# Patient Record
Sex: Male | Born: 1955 | Race: White | Hispanic: No | Smoking: Current every day smoker
Health system: Southern US, Community
[De-identification: ages and names within clinical notes are randomized; demographics above are authoritative.]

## PROBLEM LIST (undated history)

## (undated) DIAGNOSIS — R7302 Impaired glucose tolerance (oral): Secondary | ICD-10-CM

## (undated) DIAGNOSIS — E0590 Thyrotoxicosis, unspecified without thyrotoxic crisis or storm: Secondary | ICD-10-CM

## (undated) DIAGNOSIS — E785 Hyperlipidemia, unspecified: Secondary | ICD-10-CM

## (undated) DIAGNOSIS — Z86010 Personal history of colonic polyps: Secondary | ICD-10-CM

## (undated) DIAGNOSIS — H409 Unspecified glaucoma: Secondary | ICD-10-CM

## (undated) DIAGNOSIS — K649 Unspecified hemorrhoids: Secondary | ICD-10-CM

## (undated) DIAGNOSIS — K5790 Diverticulosis of intestine, part unspecified, without perforation or abscess without bleeding: Secondary | ICD-10-CM

## (undated) DIAGNOSIS — Z8719 Personal history of other diseases of the digestive system: Secondary | ICD-10-CM

## (undated) DIAGNOSIS — N529 Male erectile dysfunction, unspecified: Secondary | ICD-10-CM

## (undated) DIAGNOSIS — F419 Anxiety disorder, unspecified: Secondary | ICD-10-CM

## (undated) DIAGNOSIS — N400 Enlarged prostate without lower urinary tract symptoms: Secondary | ICD-10-CM

## (undated) DIAGNOSIS — I10 Essential (primary) hypertension: Secondary | ICD-10-CM

## (undated) HISTORY — PX: OTHER SURGICAL HISTORY: SHX169

## (undated) HISTORY — PX: TONSILLECTOMY: SUR1361

## (undated) HISTORY — PX: ESOPHAGOGASTRODUODENOSCOPY: SHX1529

## (undated) HISTORY — PX: HEMORRHOID SURGERY: SHX153

---

## 2006-09-14 ENCOUNTER — Emergency Department: Payer: Self-pay | Admitting: Emergency Medicine

## 2007-01-21 ENCOUNTER — Ambulatory Visit: Payer: Self-pay | Admitting: Family Medicine

## 2007-01-28 ENCOUNTER — Ambulatory Visit: Payer: Self-pay | Admitting: Family Medicine

## 2007-03-06 ENCOUNTER — Ambulatory Visit: Payer: Self-pay | Admitting: Gastroenterology

## 2007-03-18 ENCOUNTER — Ambulatory Visit: Payer: Self-pay | Admitting: Family Medicine

## 2007-06-10 ENCOUNTER — Ambulatory Visit: Payer: Self-pay | Admitting: Family Medicine

## 2010-07-26 ENCOUNTER — Emergency Department: Payer: Self-pay

## 2011-02-01 ENCOUNTER — Emergency Department: Payer: Self-pay | Admitting: Emergency Medicine

## 2011-10-25 HISTORY — PX: COLONOSCOPY: SHX174

## 2018-05-02 ENCOUNTER — Emergency Department: Payer: BLUE CROSS/BLUE SHIELD

## 2018-05-02 ENCOUNTER — Encounter: Payer: Self-pay | Admitting: Emergency Medicine

## 2018-05-02 ENCOUNTER — Other Ambulatory Visit: Payer: Self-pay

## 2018-05-02 ENCOUNTER — Emergency Department
Admission: EM | Admit: 2018-05-02 | Discharge: 2018-05-02 | Disposition: A | Discharge status: Home / Self Care | Payer: BLUE CROSS/BLUE SHIELD | Attending: Emergency Medicine | Admitting: Emergency Medicine

## 2018-05-02 DIAGNOSIS — S81812A Laceration without foreign body, left lower leg, initial encounter: Secondary | ICD-10-CM | POA: Diagnosis present

## 2018-05-02 DIAGNOSIS — Y999 Unspecified external cause status: Secondary | ICD-10-CM | POA: Diagnosis not present

## 2018-05-02 DIAGNOSIS — F17200 Nicotine dependence, unspecified, uncomplicated: Secondary | ICD-10-CM | POA: Diagnosis not present

## 2018-05-02 DIAGNOSIS — Y939 Activity, unspecified: Secondary | ICD-10-CM | POA: Insufficient documentation

## 2018-05-02 DIAGNOSIS — Y92513 Shop (commercial) as the place of occurrence of the external cause: Secondary | ICD-10-CM | POA: Insufficient documentation

## 2018-05-02 DIAGNOSIS — Z23 Encounter for immunization: Secondary | ICD-10-CM | POA: Diagnosis not present

## 2018-05-02 DIAGNOSIS — I10 Essential (primary) hypertension: Secondary | ICD-10-CM | POA: Insufficient documentation

## 2018-05-02 DIAGNOSIS — S41112A Laceration without foreign body of left upper arm, initial encounter: Secondary | ICD-10-CM | POA: Diagnosis not present

## 2018-05-02 DIAGNOSIS — S21112A Laceration without foreign body of left front wall of thorax without penetration into thoracic cavity, initial encounter: Secondary | ICD-10-CM | POA: Insufficient documentation

## 2018-05-02 HISTORY — DX: Essential (primary) hypertension: I10

## 2018-05-02 HISTORY — DX: Hyperlipidemia, unspecified: E78.5

## 2018-05-02 LAB — CBC WITH DIFFERENTIAL/PLATELET
Abs Immature Granulocytes: 0.04 10*3/uL (ref 0.00–0.07)
Basophils Absolute: 0 10*3/uL (ref 0.0–0.1)
Basophils Relative: 0 %
Eosinophils Absolute: 0 10*3/uL (ref 0.0–0.5)
Eosinophils Relative: 0 %
HCT: 52.3 % — ABNORMAL HIGH (ref 39.0–52.0)
Hemoglobin: 17.6 g/dL — ABNORMAL HIGH (ref 13.0–17.0)
Immature Granulocytes: 0 %
Lymphocytes Relative: 16 %
Lymphs Abs: 1.9 10*3/uL (ref 0.7–4.0)
MCH: 32.9 pg (ref 26.0–34.0)
MCHC: 33.7 g/dL (ref 30.0–36.0)
MCV: 97.8 fL (ref 80.0–100.0)
Monocytes Absolute: 0.8 10*3/uL (ref 0.1–1.0)
Monocytes Relative: 6 %
Neutro Abs: 9.2 10*3/uL — ABNORMAL HIGH (ref 1.7–7.7)
Neutrophils Relative %: 78 %
Platelets: 297 10*3/uL (ref 150–400)
RBC: 5.35 MIL/uL (ref 4.22–5.81)
RDW: 12.4 % (ref 11.5–15.5)
WBC: 11.9 10*3/uL — ABNORMAL HIGH (ref 4.0–10.5)
nRBC: 0 % (ref 0.0–0.2)

## 2018-05-02 LAB — BASIC METABOLIC PANEL
Anion gap: 13 (ref 5–15)
BUN: 19 mg/dL (ref 8–23)
CO2: 26 mmol/L (ref 22–32)
Calcium: 9.2 mg/dL (ref 8.9–10.3)
Chloride: 101 mmol/L (ref 98–111)
Creatinine, Ser: 1.56 mg/dL — ABNORMAL HIGH (ref 0.61–1.24)
GFR calc Af Amer: 54 mL/min — ABNORMAL LOW (ref >60)
GFR calc non Af Amer: 47 mL/min — ABNORMAL LOW (ref >60)
Glucose, Bld: 176 mg/dL — ABNORMAL HIGH (ref 70–99)
Potassium: 3.7 mmol/L (ref 3.5–5.1)
Sodium: 140 mmol/L (ref 135–145)

## 2018-05-02 MED ORDER — LIDOCAINE-EPINEPHRINE 2 %-1:100000 IJ SOLN
20.0000 mL | Freq: Once | INTRAMUSCULAR | Status: AC
  Administered 2018-05-02: 13:00:00 20 mL
  Filled 2018-05-02: qty 1

## 2018-05-02 MED ORDER — TETANUS-DIPHTH-ACELL PERTUSSIS 5-2.5-18.5 LF-MCG/0.5 IM SUSP
0.5000 mL | Freq: Once | INTRAMUSCULAR | Status: AC
  Administered 2018-05-02: 13:00:00 0.5 mL via INTRAMUSCULAR
  Filled 2018-05-02: qty 0.5

## 2018-05-02 MED ORDER — ONDANSETRON HCL 4 MG/2ML IJ SOLN
4.0000 mg | Freq: Once | INTRAMUSCULAR | Status: AC
  Administered 2018-05-02: 4 mg via INTRAVENOUS
  Filled 2018-05-02: qty 2

## 2018-05-02 MED ORDER — IOPAMIDOL (ISOVUE-370) INJECTION 76%
125.0000 mL | Freq: Once | INTRAVENOUS | Status: AC | PRN
  Administered 2018-05-02: 125 mL via INTRAVENOUS

## 2018-05-02 MED ORDER — MORPHINE SULFATE (PF) 4 MG/ML IV SOLN
4.0000 mg | Freq: Once | INTRAVENOUS | Status: AC
  Administered 2018-05-02: 13:00:00 4 mg via INTRAVENOUS
  Filled 2018-05-02: qty 1

## 2018-05-02 NOTE — ED Notes (Signed)
Pt calm and cooperative, no distress noted.
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
## 2018-05-02 NOTE — ED Notes (Signed)
Pt son Cherly [place] phone number [phone].

## 2018-05-02 NOTE — ED Notes (Signed)
Left leg wrapped with gauze by this RN. Pt tolerated with no issue.

## 2018-05-02 NOTE — Discharge Instructions (Signed)
The sutures in your left leg wound will need to be removed in 7 days.  You can follow-up with your primary care doctor or urgent care for this or return to the ED if necessary.  The other stitches will all dissolve and fall out on their own.  Monitor the wounds for any signs of infection including swelling, redness, warmth, increased pain, or thick drainage.  The CT scan of your leg does not show any vascular injury.  Return to the ED or urgent care in 2 days for a wound recheck to ensure appropriate healing.  If you have firmness of the left leg associated with severe worsening pain or discoloration coldness or numbness of the left foot, return to the ED right away.

## 2018-05-02 NOTE — ED Notes (Signed)
[employer] at bedside to speak with pt.

## 2018-05-29 ENCOUNTER — Other Ambulatory Visit: Payer: Self-pay | Admitting: Gastroenterology

## 2018-05-29 DIAGNOSIS — R1084 Generalized abdominal pain: Secondary | ICD-10-CM

## 2018-05-31 ENCOUNTER — Other Ambulatory Visit: Payer: Self-pay

## 2018-05-31 ENCOUNTER — Ambulatory Visit
Admission: RE | Admit: 2018-05-31 | Discharge: 2018-05-31 | Disposition: A | Discharge status: Home / Self Care | Payer: BLUE CROSS/BLUE SHIELD | Source: Ambulatory Visit | Attending: Gastroenterology | Admitting: Gastroenterology

## 2018-05-31 DIAGNOSIS — R1084 Generalized abdominal pain: Secondary | ICD-10-CM | POA: Insufficient documentation

## 2018-05-31 MED ORDER — IOHEXOL 300 MG/ML  SOLN
75.0000 mL | Freq: Once | INTRAMUSCULAR | Status: AC | PRN
  Administered 2018-05-31: 10:00:00 75 mL via INTRAVENOUS

## 2018-07-08 ENCOUNTER — Other Ambulatory Visit
Admission: RE | Admit: 2018-07-08 | Discharge: 2018-07-08 | Disposition: A | Discharge status: Home / Self Care | Payer: BLUE CROSS/BLUE SHIELD | Source: Ambulatory Visit | Attending: Gastroenterology | Admitting: Gastroenterology

## 2018-07-08 ENCOUNTER — Other Ambulatory Visit: Payer: Self-pay

## 2018-07-08 DIAGNOSIS — Z1159 Encounter for screening for other viral diseases: Secondary | ICD-10-CM | POA: Insufficient documentation

## 2018-07-09 LAB — NOVEL CORONAVIRUS, NAA (HOSP ORDER, SEND-OUT TO REF LAB; TAT 18-24 HRS): SARS-CoV-2, NAA: NOT DETECTED

## 2018-07-10 ENCOUNTER — Encounter: Payer: Self-pay | Admitting: *Deleted

## 2018-07-11 ENCOUNTER — Ambulatory Visit
Admission: RE | Admit: 2018-07-11 | Discharge: 2018-07-11 | Disposition: A | Discharge status: Home / Self Care | Payer: BLUE CROSS/BLUE SHIELD | Attending: Gastroenterology | Admitting: Gastroenterology

## 2018-07-11 ENCOUNTER — Ambulatory Visit: Payer: BLUE CROSS/BLUE SHIELD | Admitting: Anesthesiology

## 2018-07-11 ENCOUNTER — Encounter
Admission: RE | Disposition: A | Discharge status: Home / Self Care | Payer: Self-pay | Source: Home / Self Care | Attending: Gastroenterology

## 2018-07-11 ENCOUNTER — Encounter: Payer: Self-pay | Admitting: Anesthesiology

## 2018-07-11 DIAGNOSIS — R1030 Lower abdominal pain, unspecified: Secondary | ICD-10-CM | POA: Insufficient documentation

## 2018-07-11 DIAGNOSIS — R194 Change in bowel habit: Secondary | ICD-10-CM | POA: Diagnosis not present

## 2018-07-11 DIAGNOSIS — K640 First degree hemorrhoids: Secondary | ICD-10-CM | POA: Insufficient documentation

## 2018-07-11 DIAGNOSIS — D369 Benign neoplasm, unspecified site: Secondary | ICD-10-CM | POA: Insufficient documentation

## 2018-07-11 DIAGNOSIS — D124 Benign neoplasm of descending colon: Secondary | ICD-10-CM | POA: Diagnosis not present

## 2018-07-11 DIAGNOSIS — K2270 Barrett's esophagus without dysplasia: Secondary | ICD-10-CM | POA: Insufficient documentation

## 2018-07-11 DIAGNOSIS — R1084 Generalized abdominal pain: Secondary | ICD-10-CM | POA: Diagnosis present

## 2018-07-11 DIAGNOSIS — R112 Nausea with vomiting, unspecified: Secondary | ICD-10-CM | POA: Diagnosis not present

## 2018-07-11 DIAGNOSIS — D127 Benign neoplasm of rectosigmoid junction: Secondary | ICD-10-CM | POA: Insufficient documentation

## 2018-07-11 DIAGNOSIS — R634 Abnormal weight loss: Secondary | ICD-10-CM | POA: Insufficient documentation

## 2018-07-11 DIAGNOSIS — D123 Benign neoplasm of transverse colon: Secondary | ICD-10-CM | POA: Insufficient documentation

## 2018-07-11 DIAGNOSIS — F17200 Nicotine dependence, unspecified, uncomplicated: Secondary | ICD-10-CM | POA: Insufficient documentation

## 2018-07-11 DIAGNOSIS — E785 Hyperlipidemia, unspecified: Secondary | ICD-10-CM | POA: Diagnosis not present

## 2018-07-11 DIAGNOSIS — K621 Rectal polyp: Secondary | ICD-10-CM | POA: Diagnosis not present

## 2018-07-11 DIAGNOSIS — K635 Polyp of colon: Secondary | ICD-10-CM | POA: Insufficient documentation

## 2018-07-11 DIAGNOSIS — K319 Disease of stomach and duodenum, unspecified: Secondary | ICD-10-CM | POA: Insufficient documentation

## 2018-07-11 DIAGNOSIS — I10 Essential (primary) hypertension: Secondary | ICD-10-CM | POA: Diagnosis not present

## 2018-07-11 DIAGNOSIS — F419 Anxiety disorder, unspecified: Secondary | ICD-10-CM | POA: Insufficient documentation

## 2018-07-11 DIAGNOSIS — Z79899 Other long term (current) drug therapy: Secondary | ICD-10-CM | POA: Insufficient documentation

## 2018-07-11 DIAGNOSIS — K5730 Diverticulosis of large intestine without perforation or abscess without bleeding: Secondary | ICD-10-CM | POA: Insufficient documentation

## 2018-07-11 HISTORY — DX: Benign prostatic hyperplasia without lower urinary tract symptoms: N40.0

## 2018-07-11 HISTORY — DX: Personal history of colonic polyps: Z86.010

## 2018-07-11 HISTORY — DX: Unspecified hemorrhoids: K64.9

## 2018-07-11 HISTORY — PX: ESOPHAGOGASTRODUODENOSCOPY (EGD) WITH PROPOFOL: SHX5813

## 2018-07-11 HISTORY — DX: Diverticulosis of intestine, part unspecified, without perforation or abscess without bleeding: K57.90

## 2018-07-11 HISTORY — DX: Thyrotoxicosis, unspecified without thyrotoxic crisis or storm: E05.90

## 2018-07-11 HISTORY — PX: COLONOSCOPY WITH PROPOFOL: SHX5780

## 2018-07-11 HISTORY — DX: Personal history of other diseases of the digestive system: Z87.19

## 2018-07-11 HISTORY — DX: Male erectile dysfunction, unspecified: N52.9

## 2018-07-11 HISTORY — DX: Anxiety disorder, unspecified: F41.9

## 2018-07-11 HISTORY — DX: Hyperlipidemia, unspecified: E78.5

## 2018-07-11 HISTORY — DX: Unspecified glaucoma: H40.9

## 2018-07-11 HISTORY — DX: Impaired glucose tolerance (oral): R73.02

## 2018-07-11 SURGERY — COLONOSCOPY WITH PROPOFOL
Anesthesia: General

## 2018-07-11 MED ORDER — SODIUM CHLORIDE 0.9 % IV SOLN
INTRAVENOUS | Status: DC
  Administered 2018-07-11: 10:00:00 via INTRAVENOUS
  Administered 2018-07-11: 1000 mL via INTRAVENOUS

## 2018-07-11 MED ORDER — PROPOFOL 10 MG/ML IV BOLUS
INTRAVENOUS | Status: AC
  Filled 2018-07-11: qty 60

## 2018-07-11 MED ORDER — EPHEDRINE SULFATE 50 MG/ML IJ SOLN
INTRAMUSCULAR | Status: AC
  Filled 2018-07-11: qty 1

## 2018-07-11 MED ORDER — PROPOFOL 500 MG/50ML IV EMUL
INTRAVENOUS | Status: DC | PRN
  Administered 2018-07-11: 120 ug/kg/min via INTRAVENOUS

## 2018-07-11 MED ORDER — PROPOFOL 10 MG/ML IV BOLUS
INTRAVENOUS | Status: AC
  Filled 2018-07-11: qty 20

## 2018-07-11 MED ORDER — FENTANYL CITRATE (PF) 100 MCG/2ML IJ SOLN
INTRAMUSCULAR | Status: AC
  Filled 2018-07-11: qty 2

## 2018-07-11 MED ORDER — MIDAZOLAM HCL 2 MG/2ML IJ SOLN
INTRAMUSCULAR | Status: DC | PRN
  Administered 2018-07-11: 2 mg via INTRAVENOUS

## 2018-07-11 MED ORDER — MIDAZOLAM HCL 2 MG/2ML IJ SOLN
INTRAMUSCULAR | Status: AC
  Filled 2018-07-11: qty 2

## 2018-07-11 MED ORDER — FENTANYL CITRATE (PF) 100 MCG/2ML IJ SOLN
INTRAMUSCULAR | Status: DC | PRN
  Administered 2018-07-11 (×2): 50 ug via INTRAVENOUS

## 2018-07-11 MED ORDER — EPHEDRINE SULFATE 50 MG/ML IJ SOLN
INTRAMUSCULAR | Status: DC | PRN
  Administered 2018-07-11 (×3): 10 mg via INTRAVENOUS

## 2018-07-11 MED ORDER — LIDOCAINE HCL (CARDIAC) PF 100 MG/5ML IV SOSY
PREFILLED_SYRINGE | INTRAVENOUS | Status: DC | PRN
  Administered 2018-07-11: 50 mg via INTRAVENOUS

## 2018-07-11 MED ORDER — SODIUM CHLORIDE 0.9 % IV SOLN: INTRAVENOUS | Status: DC

## 2018-07-11 NOTE — Op Note (Signed)
[facility] Gastroenterology Patient Name: Alexander Short Procedure Date: 07/11/2018 8:42 AM MRN: [id number] Account #: [id number] Date of Birth: 1955-02-28 Admit Type: Outpatient Age: 63 Room: [facility] ENDO ROOM 1 Gender: Male Note Status: Finalized Procedure:            Colonoscopy Indications:          Lower abdominal pain, Change in bowel habits, Weight                        loss Providers:            Lollie Sails, MD Referring MD:         Juluis Rainier (Referring MD) Complications:        No immediate complications. Procedure:            Pre-Anesthesia Assessment:                       - ASA Grade Assessment: III - A patient with severe                        systemic disease.                       After obtaining informed consent, the colonoscope was                        passed under direct vision. Throughout the procedure,                        the patient's blood pressure, pulse, and oxygen                        saturations were monitored continuously. The                        Colonoscope was introduced through the anus and                        advanced to the the terminal ileum. The colonoscopy was                        performed without difficulty. The patient tolerated the                        procedure well. The quality of the bowel preparation                        was good. Findings:      A 3 mm polyp was found in the rectum. The polyp was sessile. The polyp       was removed with a cold biopsy forceps. Resection and retrieval were       complete.      A 3 mm polyp was found in the distal sigmoid colon. The polyp was       sessile. The polyp was removed with a cold biopsy forceps. Resection and       retrieval were complete.      A 3 mm polyp was found in the distal descending colon. The polyp was       sessile. The polyp was removed with a cold biopsy forceps. Resection and  retrieval were complete.      Two sessile polyps  were found in the hepatic flexure. The polyps were 3       to 5 mm in size. These polyps were removed with a cold snare. Resection       and retrieval were complete.      Biopsies for histology were taken with a cold forceps from the right       colon and left colon for evaluation of microscopic colitis.      A 14 mm polyp was found in the recto-sigmoid colon. The polyp was       sessile. The polyp was removed with a hot snare. Resection and retrieval       were complete.      Multiple small and medium-mouthed diverticula were found in the sigmoid       colon, descending colon, transverse colon and ascending colon.      Non-bleeding internal hemorrhoids were found during retroflexion and       during anoscopy. The hemorrhoids were small and Grade I (internal       hemorrhoids that do not prolapse). Impression:           - One 3 mm polyp in the rectum, removed with a cold                        biopsy forceps. Resected and retrieved.                       - One 3 mm polyp in the distal sigmoid colon, removed                        with a cold biopsy forceps. Resected and retrieved.                       - One 3 mm polyp in the distal descending colon,                        removed with a cold biopsy forceps. Resected and                        retrieved.                       - Two 3 to 5 mm polyps at the hepatic flexure, removed                        with a cold snare. Resected and retrieved.                       - One 14 mm polyp at the recto-sigmoid colon, removed                        with a hot snare. Resected and retrieved.                       - Diverticulosis in the sigmoid colon, in the                        descending colon, in the transverse colon and in the  ascending colon.                       - Non-bleeding internal hemorrhoids.                       - Biopsies were taken with a cold forceps from the                        right colon and left  colon for evaluation of                        microscopic colitis. Recommendation:       - Await pathology results.                       - Use Citrucel one tablespoon PO daily daily.                       - Return to GI clinic in 3 weeks.                       - Continue present medications.                       - Soft diet today, then advance as tolerated to advance                        diet as tolerated. Procedure Code(s):    --- Professional ---                       [phone], Colonoscopy, flexible; with removal of tumor(s),                        polyp(s), or other lesion(s) by snare technique                       45380, 59, Colonoscopy, flexible; with biopsy, single                        or multiple Diagnosis Code(s):    --- Professional ---                       K62.1, Rectal polyp                       K63.5, Polyp of colon                       K64.0, First degree hemorrhoids                       R10.30, Lower abdominal pain, unspecified                       R19.4, Change in bowel habit                       R63.4, Abnormal weight loss                       K57.30, Diverticulosis of large intestine without  perforation or abscess without bleeding CPT copyright 2019 American Medical Association. All rights reserved. The codes documented in this report are preliminary and upon coder review may  be revised to meet current compliance requirements. Lollie Sails, MD 07/11/2018 10:18:48 AM This report has been signed electronically. Number of Addenda: 0 Note Initiated On: 07/11/2018 8:42 AM Scope Withdrawal Time: 0 hours 19 minutes 25 seconds  Total Procedure Duration: 0 hours 35 minutes 26 seconds       [facility]

## 2018-07-11 NOTE — Transfer of Care (Signed)
   Immediate Anesthesia Transfer of Care Note  Patient: Alexander Short  Procedure(s) Performed: COLONOSCOPY WITH PROPOFOL (N/A ) ESOPHAGOGASTRODUODENOSCOPY (EGD) WITH PROPOFOL (N/A )  Patient Location: PACU  Anesthesia Type:General  Level of Consciousness: awake and sedated  Airway & Oxygen Therapy: Patient Spontanous Breathing and Patient connected to nasal cannula oxygen  Post-op Assessment: Report given to RN and Post -op Vital signs reviewed and stable  Post vital signs: Reviewed and stable  Last Vitals:  Vitals Value Taken Time  BP    Temp    Pulse    Resp    SpO2      Last Pain:  Vitals:   07/11/18 0816  TempSrc: Tympanic  PainSc: 0-No pain         Complications: No apparent anesthesia complications

## 2018-07-11 NOTE — H&P (Signed)
Outpatient short stay form Pre-procedure 07/11/2018 8:58 AM Lollie Sails MD  Primary Physician: Mercy Riding, NP  Reason for visit: EGD and colonoscopy  History of present illness: Patient is a 63 year old male presenting today as above.  He has a history of increasing abdominal discomfort over the past year however over the past 2 to 3 months has become much more prominent.  He has had about a 15 pound weight loss in that short period of time.  He states he has some occasional dark stool but does that he has been taking Pepto-Bismol.  His stool/bowel habits are variable sometimes very hard to pass a stool other times loose.  He is seen no actual blood in the stool.  He does get awakened at night with symptoms he finds that his abdominal pain improves after bowel movement.  It is of note that he has symptoms of urinary hesitancy and repeated bathroom trips at night.  He has not seen a urologist and is not on any medications in that regard.  He tolerated his prep well.  He takes occasional Motrin or ibuprofen however denies use of any aspirin product or blood thinning agent.    Current Facility-Administered Medications:  .  0.9 %  sodium chloride infusion, , Intravenous, Continuous, Lollie Sails, MD .  0.9 %  sodium chloride infusion, , Intravenous, Continuous, Lollie Sails, MD, Last Rate: 20 mL/hr at 07/11/18 0831, 1,000 mL at 07/11/18 0831  Medications Prior to Admission  Medication Sig Dispense Refill Last Dose  . ALPRAZolam (XANAX) 0.5 MG tablet Take 0.5 mg by mouth 2 (two) times daily as needed for anxiety.   Past Week at Unknown time  . lisinopril-hydrochlorothiazide (ZESTORETIC) 20-25 MG tablet Take 1 tablet by mouth daily.   07/11/2018 at 0630  . sildenafil (VIAGRA) 100 MG tablet Take 100 mg by mouth daily as needed for erectile dysfunction.   Past Week at Unknown time  . simvastatin (ZOCOR) 20 MG tablet Take 20 mg by mouth daily.   07/11/2018 at 0630     No Known  Allergies   Past Medical History:  Diagnosis Date  . Anxiety   . BPH without urinary obstruction   . Diverticulosis   . ED (erectile dysfunction)   . Glaucoma (increased eye pressure)   . Hemorrhoids   . History of colon polyps   . History of hiatal hernia   . Hyperlipemia   . Hyperlipidemia   . Hypertension   . Hyperthyroidism   . Impaired glucose tolerance     Review of systems:      Physical Exam    Heart and lungs: Good rate and rhythm without rub or gallop, lungs are bilaterally clear.    HEENT: Normocephalic atraumatic eyes are anicteric    Other:    Pertinant exam for procedure: Soft nontender nondistended bowel sounds positive normoactive    Planned proceedures: GD, colonoscopy and indicated procedures. I have discussed the risks benefits and complications of procedures to include not limited to bleeding, infection, perforation and the risk of sedation and the patient wishes to proceed.    Lollie Sails, MD Gastroenterology 07/11/2018  8:58 AM

## 2018-07-11 NOTE — Anesthesia Procedure Notes (Signed)
Performed by: Cook-Martin, Sophiea Ueda Pre-anesthesia Checklist: Patient identified, Emergency Drugs available, Suction available, Patient being monitored and Timeout performed Patient Re-evaluated:Patient Re-evaluated prior to induction Oxygen Delivery Method: Nasal cannula Preoxygenation: Pre-oxygenation with 100% oxygen Induction Type: IV induction Airway Equipment and Method: Bite block Placement Confirmation: CO2 detector and positive ETCO2       

## 2018-07-11 NOTE — Anesthesia Post-op Follow-up Note (Signed)
Anesthesia QCDR form completed.        

## 2018-07-11 NOTE — Op Note (Signed)
[facility] Gastroenterology Patient Name: Alexander Short Procedure Date: 07/11/2018 8:42 AM MRN: [id number] Account #: [id number] Date of Birth: 07-31-1955 Admit Type: Outpatient Age: 63 Room: [facility] ENDO ROOM 1 Gender: Male Note Status: Finalized Procedure:            Upper GI endoscopy Indications:          Generalized abdominal pain, Nausea with vomiting,                        Weight loss Providers:            Lollie Sails, MD Referring MD:         Juluis Rainier (Referring MD) Medicines:            Monitored Anesthesia Care Complications:        No immediate complications. Procedure:            Pre-Anesthesia Assessment:                       - ASA Grade Assessment: III - A patient with severe                        systemic disease.                       After obtaining informed consent, the endoscope was                        passed under direct vision. Throughout the procedure,                        the patient's blood pressure, pulse, and oxygen                        saturations were monitored continuously. The Endoscope                        was introduced through the mouth, and advanced to the                        third part of duodenum. The patient tolerated the                        procedure well. Findings:      The Z-line was irregular. Biopsies were taken with a cold forceps for       histology.      The exam of the stomach was otherwise normal.      Diffuse minimal inflammation characterized by congestion (edema) and       erythema was found in the gastric body. Biopsies were taken with a cold       forceps for histology. Biopsies were taken with a cold forceps for       Helicobacter pylori testing.      The cardia and gastric fundus were normal on retroflexion.      The examined duodenum was normal.      The exam of the esophagus was otherwise normal.      small lipoma noted third portion of the duodenum, positive pillow  sign Impression:           - Z-line irregular. Biopsied.                       -  Gastritis. Biopsied.                       - Normal examined duodenum. Recommendation:       - Await pathology results.                       - Continue present medications.                       - Return to GI clinic in 3 weeks.                       - Discharge patient to home. Procedure Code(s):    --- Professional ---                       [phone], Esophagogastroduodenoscopy, flexible, transoral;                        with biopsy, single or multiple Diagnosis Code(s):    --- Professional ---                       K22.8, Other specified diseases of esophagus                       K29.70, Gastritis, unspecified, without bleeding                       R10.84, Generalized abdominal pain                       R11.2, Nausea with vomiting, unspecified                       R63.4, Abnormal weight loss CPT copyright 2019 [employer]. All rights reserved. The codes documented in this report are preliminary and upon coder review may  be revised to meet current compliance requirements. Lollie Sails, MD 07/11/2018 9:31:32 AM This report has been signed electronically. Number of Addenda: 0 Note Initiated On: 07/11/2018 8:42 AM      [facility]

## 2018-07-11 NOTE — Anesthesia Preprocedure Evaluation (Addendum)
Anesthesia Evaluation  Patient identified by MRN, date of birth, ID band Patient awake    Reviewed: Allergy & Precautions, NPO status , Patient's Chart, lab work & pertinent test results  Airway Mallampati: II  TM Distance: >3 FB     Dental  (+) Dental Advisory Given, Loose   Pulmonary Current Smoker,    Pulmonary exam normal        Cardiovascular hypertension, Normal cardiovascular exam     Neuro/Psych Anxiety negative neurological ROS     GI/Hepatic Neg liver ROS, hiatal hernia, diverticulosis   Endo/Other  Hyperthyroidism   Renal/GU negative Renal ROS  negative genitourinary   Musculoskeletal negative musculoskeletal ROS (+)   Abdominal Normal abdominal exam  (+)   Peds negative pediatric ROS (+)  Hematology negative hematology ROS (+)   Anesthesia Other Findings Past Medical History: No date: Anxiety No date: BPH without urinary obstruction No date: Diverticulosis No date: ED (erectile dysfunction) No date: Glaucoma (increased eye pressure) No date: Hemorrhoids No date: History of colon polyps No date: History of hiatal hernia No date: Hyperlipemia No date: Hyperlipidemia No date: Hypertension No date: Hyperthyroidism No date: Impaired glucose tolerance  Patient superglued his upper front tooth  Reproductive/Obstetrics                            Anesthesia Physical Anesthesia Plan  ASA: II  Anesthesia Plan: General   Post-op Pain Management:    Induction: Intravenous  PONV Risk Score and Plan:   Airway Management Planned: Nasal Cannula  Additional Equipment:   Intra-op Plan:   Post-operative Plan: Extubation in OR  Informed Consent: I have reviewed the patients History and Physical, chart, labs and discussed the procedure including the risks, benefits and alternatives for the proposed anesthesia with the patient or authorized representative who has indicated  his/her understanding and acceptance.     Dental advisory given  Plan Discussed with: CRNA and Surgeon  Anesthesia Plan Comments:         Anesthesia Quick Evaluation

## 2018-07-12 ENCOUNTER — Encounter: Payer: Self-pay | Admitting: Gastroenterology

## 2018-07-12 LAB — SURGICAL PATHOLOGY

## 2018-07-12 NOTE — Anesthesia Postprocedure Evaluation (Signed)
Anesthesia Post Note  Patient: Alexander Short  Procedure(s) Performed: COLONOSCOPY WITH PROPOFOL (N/A ) ESOPHAGOGASTRODUODENOSCOPY (EGD) WITH PROPOFOL (N/A )  Patient location during evaluation: PACU Anesthesia Type: General Level of consciousness: oriented and awake and alert Pain management: pain level controlled Vital Signs Assessment: post-procedure vital signs reviewed and stable Respiratory status: spontaneous breathing Cardiovascular status: blood pressure returned to baseline Anesthetic complications: no     Last Vitals:  Vitals:   07/11/18 1057 07/11/18 1107  BP:    Pulse: (!) 46 (!) 53  Resp: 12 16  Temp:    SpO2: 100% 98%    Last Pain:  Vitals:   07/11/18 1107  TempSrc:   PainSc: 0-No pain                 Donnielle Addison

## 2018-08-06 ENCOUNTER — Ambulatory Visit (INDEPENDENT_AMBULATORY_CARE_PROVIDER_SITE_OTHER): Payer: BLUE CROSS/BLUE SHIELD | Admitting: Vascular Surgery

## 2018-08-06 ENCOUNTER — Other Ambulatory Visit: Payer: Self-pay

## 2018-08-06 ENCOUNTER — Encounter (INDEPENDENT_AMBULATORY_CARE_PROVIDER_SITE_OTHER): Payer: Self-pay | Admitting: Vascular Surgery

## 2018-08-06 DIAGNOSIS — I10 Essential (primary) hypertension: Secondary | ICD-10-CM | POA: Diagnosis not present

## 2018-08-06 DIAGNOSIS — E785 Hyperlipidemia, unspecified: Secondary | ICD-10-CM | POA: Diagnosis not present

## 2018-08-06 DIAGNOSIS — I77819 Aortic ectasia, unspecified site: Secondary | ICD-10-CM | POA: Diagnosis not present

## 2018-08-06 DIAGNOSIS — R1084 Generalized abdominal pain: Secondary | ICD-10-CM

## 2018-08-06 DIAGNOSIS — R109 Unspecified abdominal pain: Secondary | ICD-10-CM | POA: Insufficient documentation

## 2018-08-06 DIAGNOSIS — F17200 Nicotine dependence, unspecified, uncomplicated: Secondary | ICD-10-CM | POA: Diagnosis not present

## 2018-08-06 DIAGNOSIS — Z79899 Other long term (current) drug therapy: Secondary | ICD-10-CM

## 2018-08-06 NOTE — Assessment & Plan Note (Signed)
The patient has food fear, postprandial abdominal pain, and weight loss of about 15 pounds.  I have independently reviewed his CT scan.  He has fairly significant aortic atherosclerosis as well as ectasia, but his CT scan was not really a CT angiogram and assessment of his visceral vessels is difficult.  As best I can tell the celiac and superior mesenteric artery are open, but again this study is not really adequate for this.  I would recommend a mesenteric duplex for further evaluation of his visceral blood flow.  I discussed the pathophysiology and natural history of chronic mesenteric ischemia.  We will see the patient back following the study.

## 2018-08-06 NOTE — Assessment & Plan Note (Signed)
blood pressure control important in reducing the progression of atherosclerotic disease. On appropriate oral medications.  

## 2018-08-06 NOTE — Progress Notes (Signed)
Patient ID: Alexander Short, male   DOB: 1955/10/18, 63 y.o.   MRN: [id number]  Chief Complaint  Patient presents with  . New Patient (Initial Visit)    mesenteric insufficiency    HPI Alexander Short is a 63 y.o. male.  I am asked to see the patient by Dr. Gustavo Lah for evaluation of chronic mesenteric ischemia.  The patient has been having symptoms for several months.  He has difficulty with bowel movements and explosive pain in his abdomen associated with his bowel movements.  Certain things that he eats seem to make the symptoms worse.  He has been more concerned about what he eats and has been losing weight. The patient has food fear, postprandial abdominal pain, and weight loss of about 15 pounds.  I have independently reviewed his CT scan.  He has fairly significant aortic atherosclerosis as well as ectasia, but his CT scan was not really a CT angiogram and assessment of his visceral vessels is difficult.  As best I can tell the celiac and superior mesenteric artery are open, but again this study is not really adequate for this.  He has been seen by gastroenterology who is done a thorough evaluation.  Given the obvious concerns about his symptoms of chronic visceral ischemia, he is referred for further evaluation and treatment   Past Medical History:  Diagnosis Date  . Anxiety   . BPH without urinary obstruction   . Diverticulosis   . ED (erectile dysfunction)   . Glaucoma (increased eye pressure)   . Hemorrhoids   . History of colon polyps   . History of hiatal hernia   . Hyperlipemia   . Hyperlipidemia   . Hypertension   . Hyperthyroidism   . Impaired glucose tolerance     Past Surgical History:  Procedure Laterality Date  . BYPASS GRAFT AORTOBIFEMORAL    . COLONOSCOPY  10/25/2011  . COLONOSCOPY WITH PROPOFOL N/A 07/11/2018   Procedure: COLONOSCOPY WITH PROPOFOL;  Surgeon: Lollie Sails, MD;  Location: [facility] ENDOSCOPY;  Service: Endoscopy;  Laterality: N/A;  .  ESOPHAGOGASTRODUODENOSCOPY    . ESOPHAGOGASTRODUODENOSCOPY (EGD) WITH PROPOFOL N/A 07/11/2018   Procedure: ESOPHAGOGASTRODUODENOSCOPY (EGD) WITH PROPOFOL;  Surgeon: Lollie Sails, MD;  Location: [facility] ENDOSCOPY;  Service: Endoscopy;  Laterality: N/A;  . HEMORRHOID SURGERY    . TONSILLECTOMY      Family History No bleeding disorders, clotting disorders, autoimmune disease or aneurysms  Social History Social History   Tobacco Use  . Smoking status: Current Every Day Smoker    Years: 20.00  . Smokeless tobacco: Never Used  Substance Use Topics  . Alcohol use: Yes    Comment: 4 OR MORE  A WEEK  . Drug use: Yes    Types: Marijuana    Comment: HX. OF RECREATIONAL DRUG USE     No Known Allergies  Current Outpatient Medications  Medication Sig Dispense Refill  . ALPRAZolam (XANAX) 0.5 MG tablet Take 0.5 mg by mouth 2 (two) times daily as needed for anxiety.    Marland Kitchen lisinopril-hydrochlorothiazide (ZESTORETIC) 20-25 MG tablet Take 1 tablet by mouth daily.    . simvastatin (ZOCOR) 20 MG tablet Take 20 mg by mouth daily.    . pantoprazole (PROTONIX) 40 MG tablet     . sildenafil (VIAGRA) 100 MG tablet Take 100 mg by mouth daily as needed for erectile dysfunction.     No current facility-administered medications for this visit.       REVIEW OF SYSTEMS (  Negative unless checked)  Constitutional: [x] Weight loss  [] Fever  [] Chills Cardiac: [] Chest pain   [] Chest pressure   [] Palpitations   [] Shortness of breath when laying flat   [] Shortness of breath at rest   [] Shortness of breath with exertion. Vascular:  [] Pain in legs with walking   [] Pain in legs at rest   [] Pain in legs when laying flat   [] Claudication   [] Pain in feet when walking  [] Pain in feet at rest  [] Pain in feet when laying flat   [] History of DVT   [] Phlebitis   [] Swelling in legs   [] Varicose veins   [] Non-healing ulcers Pulmonary:   [] Uses home oxygen   [] Productive cough   [] Hemoptysis   [] Wheeze  [] COPD    [] Asthma Neurologic:  [] Dizziness  [] Blackouts   [] Seizures   [] History of stroke   [] History of TIA  [] Aphasia   [] Temporary blindness   [] Dysphagia   [] Weakness or numbness in arms   [] Weakness or numbness in legs Musculoskeletal:  [] Arthritis   [] Joint swelling   [] Joint pain   [] Low back pain Hematologic:  [] Easy bruising  [] Easy bleeding   [] Hypercoagulable state   [] Anemic  [] Hepatitis Gastrointestinal:  [] Blood in stool   [] Vomiting blood  [x] Gastroesophageal reflux/heartburn   [x] Abdominal pain Genitourinary:  [] Chronic kidney disease   [] Difficult urination  [] Frequent urination  [] Burning with urination   [] Hematuria Skin:  [] Rashes   [] Ulcers   [] Wounds Psychological:  [] History of anxiety   []  History of major depression.    Physical Exam BP 120/71   Pulse 70   Resp 17   Ht 5\' 6"  (1.676 m)   Wt 150 lb (68 kg)   BMI 24.21 kg/m  Gen:  WD/WN, NAD.  Very fit appearing Head: [place]/AT, No temporalis wasting.  Ear/Nose/Throat: Hearing grossly intact, nares w/o erythema or drainage, oropharynx w/o Erythema/Exudate Eyes: Conjunctiva clear, sclera non-icteric  Neck: trachea midline.  No JVD.  Pulmonary:  Good air movement, respirations not labored, no use of accessory muscles  Cardiac: RRR, no JVD Vascular:  Vessel Right Left  Radial Palpable Palpable                                   Gastrointestinal:. No masses, surgical incisions, or scars.  Mild tenderness to palpation Musculoskeletal: M/S 5/5 throughout.  Extremities without ischemic changes.  No deformity or atrophy.  No significant lower extremity edema. Neurologic: Sensation grossly intact in extremities.  Symmetrical.  Speech is fluent. Motor exam as listed above. Psychiatric: Judgment intact, Mood & affect appropriate for pt's clinical situation. Dermatologic: No rashes or ulcers noted.  No cellulitis or open wounds.    Radiology No results found.  Labs Recent Results (from the past 2160 hour(s))  Novel  Coronavirus, NAA (hospital order; send-out to ref lab)     Status: None   Collection Time: 07/08/18 12:02 PM   Specimen: Nasopharyngeal Swab; Respiratory  Result Value Ref Range   SARS-CoV-2, NAA NOT DETECTED NOT DETECTED    Comment: (NOTE) This test was developed and its performance characteristics determined by [employer]. This test has not been FDA cleared or approved. This test has been authorized by FDA under an Emergency Use Authorization (EUA). This test is only authorized for the duration of time the declaration that circumstances exist justifying the authorization of the emergency use of in vitro diagnostic tests for detection of SARS-CoV-2 virus and/or diagnosis of  COVID-19 infection under section 564(b)(1) of the Act, 21 U.S.C. 086VHQ-4(O)(9), unless the authorization is terminated or revoked sooner. When diagnostic testing is negative, the possibility of a false negative result should be considered in the context of a patient's recent exposures and the presence of clinical signs and symptoms consistent with COVID-19. An individual without symptoms of COVID-19 and who is not shedding SARS-CoV-2 virus would expect to have a negative (not detected) result in this assay. Performed  At: [facility] [address] [place], [place] [id number] Rush Farmer MD KG:[phone]    Coronavirus Source NASOPHARYNGEAL     Comment: Performed at [facility], [address] [place], [place] [address]  Surgical pathology     Status: None   Collection Time: 07/11/18  9:21 AM  Result Value Ref Range   SURGICAL PATHOLOGY      Surgical Pathology CASE: [id number] PATIENT: Alexander Short Surgical Pathology Report     SPECIMEN SUBMITTED: A. Stomach, antrum and body; cbx B. GEJ; cbx C. Rectum polyp; cbx D. Colon polyp, distal sigmoid; cbx E. Colon polyp, distal descending; cbx F. Colon, random right; cbx G. Colon polyp x2, hepatic flexure; cold  snare H. Colon, random left; cbx I. Colon polyp x2, rectosigmoid; hot snare/cbx  CLINICAL HISTORY: None provided  PRE-OPERATIVE DIAGNOSIS: Generalized abdominal pain, NV, change in bowel habit, weight loss  POST-OPERATIVE DIAGNOSIS: Mild gastritis, irregular GEJ, polyps, diverticulosis     DIAGNOSIS: A.  STOMACH, ANTRUM AND BODY; COLD BIOPSY: - MILD REACTIVE GASTROPATHY. - NEGATIVE FOR ACTIVE INFLAMMATION, H. PYLORI, INTESTINAL METAPLASIA, DYSPLASIA, AND MALIGNANCY.  B.  GASTROESOPHAGEAL JUNCTION; COLD BIOPSY: - SQUAMOCOLUMNAR MUCOSA WITH FOCAL INTESTINAL METAPLASIA (GOBLET CELLS) AND MILD CHRONIC INFLAMMATION. - NEGATIVE FOR DYSPL ASIA AND MALIGNANCY. - CORRELATION WITH THE ENDOSCOPIC APPEARANCE IS REQUIRED.  C.  RECTUM POLYP; COLD BIOPSY: - HYPERPLASTIC POLYP. - NEGATIVE FOR DYSPLASIA AND MALIGNANCY.  D. COLON POLYP, DISTAL SIGMOID; COLD BIOPSY: - HYPERPLASTIC POLYP, 3 FRAGMENTS. - NEGATIVE FOR DYSPLASIA AND MALIGNANCY.  E.  COLON POLYP, DISTAL DESCENDING; COLD BIOPSY: - TUBULAR ADENOMA. - NEGATIVE FOR HIGH-GRADE DYSPLASIA AND MALIGNANCY.  F.  RIGHT COLON; RANDOM COLD BIOPSY: - COLONIC MUCOSA WITH INTACT CRYPT ARCHITECTURE. - NEGATIVE FOR MICROSCOPIC COLITIS.  G. COLON POLYP X 2, HEPATIC FLEXURE; COLD SNARE: - SESSILE SERRATED POLYP, NEGATIVE FOR DYSPLASIA AND MALIGNANCY, ONE FRAGMENT. - ONE FRAGMENT WITH MINIMAL CRYPT HYPERPLASIA, NEGATIVE FOR DYSPLASIA AND MALIGNANCY.  H.  LEFT COLON; RANDOM COLD BIOPSY: - COLONIC MUCOSA WITH INTACT CRYPT ARCHITECTURE. - NEGATIVE FOR MICROSCOPIC COLITIS.  I.  COLON POLYP NEXT 2, RECTOSIGMOID; HOT SNARE/COLD BIOPSY: - TRADITIONAL SERRATED ADENOMA,  FLAT TYPE, NEGATIVE FOR HIGH-GRADE DYSPLASIA AND MALIGNANCY, SEVERAL FRAGMENTS. - HYPERPLASTIC POLYP, ONE FRAGMENT, NEGATIVE FOR DYSPLASIA AND MALIGNANCY.  GROSS DESCRIPTION: A. Labeled: Cold biopsy stomach antrum and body Received: In formalin Tissue fragment(s):  Multiple Size: Aggregate 1 x 0.5 x 0.2 cm Description: Pink-tan tissue fragments Entirely submitted in 1 cassette.  B. Labeled: Cold biopsy GEJ Received: In formalin Tissue fragment(s): 3 Size: Aggregate 0.6 x 0.2 x 0.2 cm Description: Tan tissue fragments Entirely submitted in 1 cassette.  C. Labeled: Cold biopsy rectal polyp Received: In formalin Tissue fragment(s): 2 Size: Eight 0.3 x 0.2 x 0.2 cm Description: Tan tissue fragments Entirely submitted in 1 cassette.  D. Labeled: Cold biopsy distal sigmoid colon polyp Received: In formalin Tissue fragment(s): 3 Size: Aggregate 0.8 x 0.3 x 0.2 cm Description: Pink-white tissue fragments Entirely submitted in 1 cassette.  E. Labeled: Cold biops  y distal descending colon polyp Received: In formalin Tissue fragment(s): 1 Size: 0.5 x 0.3 x 0.2 cm Description: Tan tissue fragment Entirely submitted in 1 cassette.  F. Labeled: Random cold biopsy right colon Received: In formalin Tissue fragment(s): 4 Size: Aggregate 0.8 x 0.4 x 0.2 cm Description: Pink-red tissue fragments Entirely submitted in 1 cassette.  G. Labeled: Cold snare hepatic flexure polyp x2 Received: In formalin Tissue fragment(s): 2 Size: Aggregate 0.8 x 0.4 x 0.2 cm Description: Pink-tan tissue fragments Entirely submitted in 1 cassette.  H. Labeled: Random cold biopsy left colon Received: In formalin Tissue fragment(s): 3 Size: Aggregate 0.7 x 0.2 x 0.2 cm Description: Pink-red tissue fragments Entirely submitted in 1 cassette.  I. Labeled: Hot snare/cold biopsy rectosigmoid polyps x2 Received: In formalin Tissue fragment(s): Several Size: Aggregate 1.4 x 0.6 x 0.2 cm Description: Pink-red polypoid fragments Entirely submitted in 1  cassette.   Final Diagnosis performed by Bryan Lemma, MD.   Electronically signed 07/12/2018 2:26:19PM The electronic signature indicates that the named Attending Pathologist has evaluated the specimen  Technical  component performed at [facility], [address], [place], [place] [address] Lab: [phone] Dir: Rush Farmer, MD, MMM  Professional component performed at [facility], [facility], [place], [place], [place] [address] Lab: [phone] Dir: Dellia Nims. Rubinas, MD     Assessment/Plan:  Essential hypertension blood pressure control important in reducing the progression of atherosclerotic disease. On appropriate oral medications.   Hyperlipidemia lipid control important in reducing the progression of atherosclerotic disease. Continue statin therapy   Tobacco use disorder We had a discussion for approximately 3 minutes regarding the absolute need for smoking cessation due to the deleterious nature of tobacco on the vascular system. We discussed the tobacco use would diminish patency of any intervention, and likely significantly worsen progressio of disease. We discussed multiple agents for quitting including replacement therapy or medications to reduce cravings such as Chantix. The patient voices their understanding of the importance of smoking cessation.   Aortic ectasia (HCC) His aorta measures approximately 2.8 cm on his recent CT scan and is highly calcific.  This should be checked in 2 to 3 years to assess for aneurysmal degeneration.  Abdominal pain The patient has food fear, postprandial abdominal pain, and weight loss of about 15 pounds.  I have independently reviewed his CT scan.  He has fairly significant aortic atherosclerosis as well as ectasia, but his CT scan was not really a CT angiogram and assessment of his visceral vessels is difficult.  As best I can tell the celiac and superior mesenteric artery are open, but again this study is not really adequate for this.  I would recommend a mesenteric duplex for further evaluation of his visceral blood flow.  I discussed the pathophysiology and natural history of chronic mesenteric ischemia.  We will see the patient  back following the study.      Leotis Pain 08/06/2018, 4:08 PM   This note was created with Dragon medical transcription system.  Any errors from dictation are unintentional.

## 2018-08-06 NOTE — Assessment & Plan Note (Signed)

## 2018-08-06 NOTE — Assessment & Plan Note (Signed)
lipid control important in reducing the progression of atherosclerotic disease. Continue statin therapy  

## 2018-08-06 NOTE — Patient Instructions (Signed)
Chronic Mesenteric Ischemia  Chronic mesenteric ischemia is poor blood flow (circulation) in the vessels that supply blood to the stomach, intestines, and liver (mesenteric organs). When the blood supply is severely restricted, these organs cannot work properly. This condition is also called mesenteric angina, or intestinal angina. This condition is a long-term (chronic) condition. It happens when an artery or vein that provides blood to the mesenteric organs gradually becomes blocked or narrows over time, restricting the blood supply to these organs. What are the causes? This condition is commonly caused by fatty deposits that build up in an artery (plaque), which can narrow the artery and restrict blood flow. Other causes include:  Weakened areas in blood vessel walls (aneurysms).  Conditions that cause twisting or inflammation of blood vessels, such as fibromuscular dysplasia or arteritis.  A disorder in which blood clots form in the veins (venous thrombosis).  Scarring and thickening (fibrosis) of blood vessels caused by radiation therapy.  A tear in the aorta, the body's main artery (aortic dissection).  Blood vessel problems after illegal drug use, such as use of cocaine.  Tumors in the nervous system (neurofibromatosis).  Certain autoimmune diseases, such as lupus. What increases the risk? The following factors may make you more likely to develop this condition:  Being male.  Being over age 50, especially if you have a history of heart problems.  Smoking.  Having congestive heart failure.  Having an irregular heartbeat (arrhythmia).  Having a history of heart attack or stroke.  Having diabetes.  Having high cholesterol.  Having high blood pressure (hypertension).  Being overweight or obese.  Having kidney disease (renal disease) that requires dialysis. What are the signs or symptoms? Symptoms of this condition include:  Pain or cramps in the abdomen that  develop 15-60 minutes after a meal. This pain may last for 1-3 hours. Some people may develop a fear of eating because of this symptom.  Weight loss.  Diarrhea.  Bloody stool.  Nausea.  Vomiting.  Bloating.  Abdominal pain after stress or with exercise. How is this diagnosed? This condition is diagnosed based on:  Your medical history.  A physical exam.  Tests, such as: ? Ultrasound. ? CT scan. ? Blood tests. ? Urine tests. ? An imaging test that involves injecting a dye into your arteries to show blood flow through blood vessels (angiogram). This can help to show if there are any blockages in the vessels that lead to the intestines. ? Passing a small probe through the mouth and into the stomach to measure the output of carbon dioxide (gastric tonometry). This can help to indicate whether there is decreased blood flow to the stomach and intestines. How is this treated? This condition may be treated with:  Dietary changes such as eating smaller, low-fat, meals more frequently.  Lifestyle changes to treat underlying conditions that contribute to the disease, such as high cholesterol and high blood pressure.  Medicines to reduce blood clotting and increase blood flow.  Surgery to remove the blockage, repair arteries or veins, and restore blood flow. This may involve: ? Angioplasty. This is surgery to widen the affected artery, reduce the blockage, and sometimes insert a small, mesh tube (stent). ? Bypass surgery. This may be done to go around (bypass) the blockage and reconnect healthy arteries or veins. ? Placing a stent in the affected area. This may be done to help keep blocked arteries open. Follow these instructions at home: Eating and drinking   Eat a heart-healthy diet. This   includes fresh fruits and vegetables, whole grains, and lean proteins like chicken, fish, and beans.  Avoid foods that contain a lot of: ? Salt (sodium). ? Sugar. ? Saturated fat (such as  red meat). ? Trans fat (such as in fried foods).  Stay hydrated. Drink enough fluid to keep your urine pale yellow. Lifestyle  Stay active and get regular exercise as told by your health care provider. Aim for 150 minutes of moderate activity or 75 minutes of vigorous activity a week. Ask your health care provider what activities and forms of exercise are safe for you.  Maintain a healthy weight.  Work with your health care provider to manage your cholesterol.  Manage any other health problems you have, such as high blood pressure, diabetes, or heart rhythm problems.  Do not use any products that contain nicotine or tobacco, such as cigarettes, e-cigarettes, and chewing tobacco. If you need help quitting, ask your health care provider. General instructions  Take over-the-counter and prescription medicines only as told by your health care provider.  Keep all follow-up visits as told by your health care provider. This is important.  You may need to take actions to prevent or treat constipation, such as: ? Drink enough fluid to keep your urine pale yellow. ? Take over-the-counter or prescription medicines. ? Eat foods that are high in fiber, such as beans, whole grains, and fresh fruits and vegetables. ? Limit foods that are high in fat and processed sugars, such as fried or sweet foods. Contact a health care provider if:  Your symptoms do not improve or they return after treatment.  You have a fever.  You are constipated. Get help right away if you:  Have severe abdominal pain.  Have severe chest pain.  Have shortness of breath.  Feel weak or dizzy.  Have fast or irregular heartbeats (palpitations).  Have numbness or weakness in your face, arm, or leg.  Are confused.  Have trouble speaking or people have trouble understanding what you are saying.  Have trouble urinating.  Have blood in your stool.  Have severe nausea, vomiting, or persistent diarrhea. These  symptoms may represent a serious problem that is an emergency. Do not wait to see if the symptoms will go away. Get medical help right away. Call your local emergency services (911 in the [place] Do not drive yourself to the hospital. Summary  Mesenteric ischemia is poor circulation in the vessels that supply blood to the the stomach, intestines, and liver (mesenteric organs).  This condition happens when an artery or vein that provides blood to the mesenteric organs gradually becomes blocked or narrow, restricting the blood supply to the organs.  This condition is commonly caused by fatty deposits that build up in an artery (plaque), which can narrow the artery and restrict blood flow.  You are more likely to develop this condition if you are over age 85 and have a history of heart problems, high blood pressure, diabetes, or high cholesterol.  This condition is usually treated with medicines, dietary and lifestyle changes, and surgery to remove the blockage, repair arteries or veins, and restore blood flow. This information is not intended to replace advice given to you by your health care provider. Make sure you discuss any questions you have with your health care provider. Document Released: 08/29/2010 Document Revised: 09/14/2017 Document Reviewed: 09/14/2017 Elsevier Patient Education  2020 [employer].

## 2018-08-06 NOTE — Assessment & Plan Note (Signed)
His aorta measures approximately 2.8 cm on his recent CT scan and is highly calcific.  This should be checked in 2 to 3 years to assess for aneurysmal degeneration.

## 2018-08-28 ENCOUNTER — Other Ambulatory Visit: Payer: Self-pay

## 2018-08-28 ENCOUNTER — Encounter (INDEPENDENT_AMBULATORY_CARE_PROVIDER_SITE_OTHER): Payer: Self-pay | Admitting: Nurse Practitioner

## 2018-08-28 ENCOUNTER — Ambulatory Visit (INDEPENDENT_AMBULATORY_CARE_PROVIDER_SITE_OTHER): Payer: BLUE CROSS/BLUE SHIELD | Admitting: Nurse Practitioner

## 2018-08-28 ENCOUNTER — Ambulatory Visit (INDEPENDENT_AMBULATORY_CARE_PROVIDER_SITE_OTHER): Payer: BLUE CROSS/BLUE SHIELD

## 2018-08-28 VITALS — BP 131/74 | HR 47 | Resp 16 | Ht 66.0 in | Wt 149.0 lb

## 2018-08-28 DIAGNOSIS — I77819 Aortic ectasia, unspecified site: Secondary | ICD-10-CM

## 2018-08-28 DIAGNOSIS — R1084 Generalized abdominal pain: Secondary | ICD-10-CM

## 2018-08-28 DIAGNOSIS — F17200 Nicotine dependence, unspecified, uncomplicated: Secondary | ICD-10-CM

## 2018-08-28 DIAGNOSIS — I10 Essential (primary) hypertension: Secondary | ICD-10-CM

## 2018-08-28 NOTE — Progress Notes (Signed)
SUBJECTIVE:  Patient ID: Alexander Short, male    DOB: 06-Mar-1955, 63 y.o.   MRN: [id number] Chief Complaint  Patient presents with  . Follow-up    ultrasound    HPI  Alexander Short is a 63 y.o. male that presents today for follow-up with noninvasive studies due to abdominal pain.  The patient has had several symptoms including severe difficulty with bowel movements and extreme pain in his lower ab associated with bowel movements.  Some foods seem to make it worse.  The patient has food fear, some postprandial abdominal pain and has had about 15 pounds of weight loss in the last few months.  Patiently recently underwent a CT scan which showed significant aortic atherosclerosis as well as some ectasia however because this was not a CT angiogram the visceral vessels were hard to assess.  Today the patient presented for noninvasive studies to determine the status of the mesenteric arteries.  Noninvasive studies today found celiac, mesenteric, splenic, and hepatic artery findings.  There were no areas of hemodynamically significant velocity elevations.  Past Medical History:  Diagnosis Date  . Anxiety   . BPH without urinary obstruction   . Diverticulosis   . ED (erectile dysfunction)   . Glaucoma (increased eye pressure)   . Hemorrhoids   . History of colon polyps   . History of hiatal hernia   . Hyperlipemia   . Hyperlipidemia   . Hypertension   . Hyperthyroidism   . Impaired glucose tolerance     Past Surgical History:  Procedure Laterality Date  . BYPASS GRAFT AORTOBIFEMORAL    . COLONOSCOPY  10/25/2011  . COLONOSCOPY WITH PROPOFOL N/A 07/11/2018   Procedure: COLONOSCOPY WITH PROPOFOL;  Surgeon: Lollie Sails, MD;  Location: [facility] ENDOSCOPY;  Service: Endoscopy;  Laterality: N/A;  . ESOPHAGOGASTRODUODENOSCOPY    . ESOPHAGOGASTRODUODENOSCOPY (EGD) WITH PROPOFOL N/A 07/11/2018   Procedure: ESOPHAGOGASTRODUODENOSCOPY (EGD) WITH PROPOFOL;  Surgeon: Lollie Sails, MD;   Location: [facility] ENDOSCOPY;  Service: Endoscopy;  Laterality: N/A;  . HEMORRHOID SURGERY    . TONSILLECTOMY      Social History   Socioeconomic History  . Marital status: Widowed    Spouse name: Not on file  . Number of children: Not on file  . Years of education: Not on file  . Highest education level: Not on file  Occupational History  . Not on file  Social Needs  . Financial resource strain: Not on file  . Food insecurity    Worry: Not on file    Inability: Not on file  . Transportation needs    Medical: Not on file    Non-medical: Not on file  Tobacco Use  . Smoking status: Current Every Day Smoker    Years: 20.00  . Smokeless tobacco: Never Used  Substance and Sexual Activity  . Alcohol use: Yes    Comment: 4 OR MORE  A WEEK  . Drug use: Yes    Types: Marijuana    Comment: HX. OF RECREATIONAL DRUG USE  . Sexual activity: Not on file  Lifestyle  . Physical activity    Days per week: Not on file    Minutes per session: Not on file  . Stress: Not on file  Relationships  . Social Herbalist on phone: Not on file    Gets together: Not on file    Attends religious service: Not on file    Active member of club or organization: Not on  file    Attends meetings of clubs or organizations: Not on file    Relationship status: Not on file  . Intimate partner violence    Fear of current or ex partner: Not on file    Emotionally abused: Not on file    Physically abused: Not on file    Forced sexual activity: Not on file  Other Topics Concern  . Not on file  Social History Narrative  . Not on file    History reviewed. No pertinent family history.  No Known Allergies   Review of Systems   Review of Systems: Negative Unless Checked Constitutional: [x] Weight loss  [] Fever  [] Chills Cardiac: [] Chest pain   []  Atrial Fibrillation  [] Palpitations   [] Shortness of breath when laying flat   [] Shortness of breath with exertion. [] Shortness of breath at rest  Vascular:  [] Pain in legs with walking   [] Pain in legs with standing [] Pain in legs when laying flat   [] Claudication    [] Pain in feet when laying flat    [] History of DVT   [] Phlebitis   [] Swelling in legs   [] Varicose veins   [] Non-healing ulcers Pulmonary:   [] Uses home oxygen   [] Productive cough   [] Hemoptysis   [] Wheeze  [] COPD   [] Asthma Neurologic:  [] Dizziness   [] Seizures  [] Blackouts [] History of stroke   [] History of TIA  [] Aphasia   [] Temporary Blindness   [] Weakness or numbness in arm   [] Weakness or numbness in leg Musculoskeletal:   [] Joint swelling   [] Joint pain   [] Low back pain  []  History of Knee Replacement [] Arthritis [] back Surgeries  []  Spinal Stenosis    Hematologic:  [] Easy bruising  [] Easy bleeding   [] Hypercoagulable state   [] Anemic Gastrointestinal:  [x] Diarrhea   [] Vomiting  [x] Gastroesophageal reflux/heartburn   [] Difficulty swallowing. [x] Abdominal pain Genitourinary:  [] Chronic kidney disease   [] Difficult urination  [] Anuric   [] Blood in urine [] Frequent urination  [] Burning with urination   [] Hematuria Skin:  [] Rashes   [] Ulcers [] Wounds Psychological:  [x] History of anxiety   []  History of major depression  []  Memory Difficulties      OBJECTIVE:   Physical Exam  BP 131/74 (BP Location: Right Arm)   Pulse (!) 47   Resp 16   Ht 5\' 6"  (1.676 m)   Wt 149 lb (67.6 kg)   BMI 24.05 kg/m   Gen: WD/WN, NAD Head: /AT, No temporalis wasting.  Ear/Nose/Throat: Hearing grossly intact, nares w/o erythema or drainage Eyes: PER, EOMI, sclera nonicteric.  Neck: Supple, no masses.  No JVD.  Pulmonary:  Good air movement, no use of accessory muscles.  Cardiac: RRR Vascular:  Vessel Right Left  Radial Palpable Palpable  Dorsalis Pedis Palpable Palpable  Posterior Tibial Palpable Palpable   Gastrointestinal: soft, non-distended. No guarding/no peritoneal signs.  Musculoskeletal: M/S 5/5 throughout.  No deformity or atrophy.  Neurologic: Pain and light touch  intact in extremities.  Symmetrical.  Speech is fluent. Motor exam as listed above. Psychiatric: Judgment intact, Mood & affect appropriate for pt's clinical situation. Dermatologic: No Venous rashes. No Ulcers Noted.  No changes consistent with cellulitis. Lymph : No Cervical lymphadenopathy, no lichenification or skin changes of chronic lymphedema.       ASSESSMENT AND PLAN:  1. Aortic ectasia (HCC) Patient has some significant aortic atherosclerosis as well as minor enlargement of his abdominal aortic area.  Due to the fact that these can enlarge over time I have instructed the patient that we will  check again in 2 years to see if it has progressed to aneurysm status.  I also discussed with the patient peripheral artery disease and signs symptoms such as claudication, rest pain or ischemic symptoms.  Patient will contact her office if he begins to experience any of those otherwise, the patient will follow-up in 2 years with a abdominal aortic aneurysm scan. - VAS [place] AAA DUPLEX; Future  2. Essential hypertension Continue antihypertensive medications as already ordered, these medications have been reviewed and there are no changes at this time.   3. Generalized abdominal pain Unfortunately based on the noninvasive studies that we did today we are unable to pinpoint a vascular cause for abdominal pain.  This unfortunately has been very distressing for the patient to not have an answer or possible treatment plan.  The patient is referred back to his GI doctor as well as primary care physician to examine further causes of pain.  4. Tobacco use disorder Smoking cessation was discussed, 3-10 minutes spent on this topic specifically    Current Outpatient Medications on File Prior to Visit  Medication Sig Dispense Refill  . ALPRAZolam (XANAX) 0.5 MG tablet Take 0.5 mg by mouth 2 (two) times daily as needed for anxiety.    Marland Kitchen lisinopril-hydrochlorothiazide (ZESTORETIC) 20-25 MG tablet Take 1  tablet by mouth daily.    . pantoprazole (PROTONIX) 40 MG tablet     . simvastatin (ZOCOR) 20 MG tablet Take 20 mg by mouth daily.    . sildenafil (VIAGRA) 100 MG tablet Take 100 mg by mouth daily as needed for erectile dysfunction.     No current facility-administered medications on file prior to visit.     There are no Patient Instructions on file for this visit. No follow-ups on file.   Kris Hartmann, NP  This note was completed with Sales executive.  Any errors are purely unintentional.

## 2018-09-04 ENCOUNTER — Ambulatory Visit (INDEPENDENT_AMBULATORY_CARE_PROVIDER_SITE_OTHER): Payer: BLUE CROSS/BLUE SHIELD | Admitting: Urology

## 2018-09-04 ENCOUNTER — Other Ambulatory Visit: Payer: Self-pay

## 2018-09-04 DIAGNOSIS — E279 Disorder of adrenal gland, unspecified: Secondary | ICD-10-CM

## 2018-09-04 NOTE — Progress Notes (Signed)
   09/04/18 10:13 AM   Alexander Short 07/14/1955 [id number]  Referring provider: Sallee Lange, NP [address] [place],  [place] [address]  Patient was referred for reportedly "BPH with urinary symptoms" however he is extremely upset and frustrated today and denies any urinary symptoms.  He has had chronic and extremely bothersome diarrhea, nausea, and abdominal pain and is followed by gastroenterology for this.  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  He saw endocrinology with [facility] on 09/02/2018 and they scheduled hormonal work-up of the nodule, as well as repeat imaging in 1 year.  The patient was very frustrated that he was scheduled to see another provider for "the same problem" and wanted to be refunded and left clinic today.  With his complete lack of urinary symptoms, and endocrinology working up his adrenal nodule and following him up in 1 year with a repeat scan, I think this is reasonable.  I am happy to see him in the future if he develops other urologic problems  Billey Co, MD  [place] [address], [place], [place] [address] [phone]

## 2018-12-10 ENCOUNTER — Other Ambulatory Visit: Payer: Self-pay | Admitting: Nurse Practitioner

## 2018-12-10 DIAGNOSIS — N63 Unspecified lump in unspecified breast: Secondary | ICD-10-CM

## 2018-12-16 ENCOUNTER — Ambulatory Visit
Admission: RE | Admit: 2018-12-16 | Discharge: 2018-12-16 | Disposition: A | Discharge status: Home / Self Care | Payer: BLUE CROSS/BLUE SHIELD | Source: Ambulatory Visit | Attending: Nurse Practitioner | Admitting: Nurse Practitioner

## 2018-12-16 DIAGNOSIS — N63 Unspecified lump in unspecified breast: Secondary | ICD-10-CM

## 2020-11-29 ENCOUNTER — Other Ambulatory Visit: Payer: Self-pay | Admitting: Surgery

## 2020-11-30 ENCOUNTER — Encounter
Admission: RE | Disposition: A | Discharge status: Home / Self Care | Payer: Self-pay | Source: Home / Self Care | Attending: Surgery

## 2020-11-30 ENCOUNTER — Ambulatory Visit: Payer: Commercial Managed Care - HMO | Admitting: Anesthesiology

## 2020-11-30 ENCOUNTER — Ambulatory Visit: Payer: Commercial Managed Care - HMO

## 2020-11-30 ENCOUNTER — Ambulatory Visit
Admission: RE | Admit: 2020-11-30 | Discharge: 2020-11-30 | Disposition: A | Discharge status: Home / Self Care | Payer: Commercial Managed Care - HMO | Attending: Surgery | Admitting: Surgery

## 2020-11-30 ENCOUNTER — Other Ambulatory Visit: Payer: Self-pay

## 2020-11-30 ENCOUNTER — Encounter: Payer: Self-pay | Admitting: Surgery

## 2020-11-30 DIAGNOSIS — Y9353 Activity, golf: Secondary | ICD-10-CM | POA: Insufficient documentation

## 2020-11-30 DIAGNOSIS — I10 Essential (primary) hypertension: Secondary | ICD-10-CM | POA: Diagnosis not present

## 2020-11-30 DIAGNOSIS — Z79899 Other long term (current) drug therapy: Secondary | ICD-10-CM | POA: Diagnosis not present

## 2020-11-30 DIAGNOSIS — S46212A Strain of muscle, fascia and tendon of other parts of biceps, left arm, initial encounter: Secondary | ICD-10-CM | POA: Insufficient documentation

## 2020-11-30 HISTORY — PX: DISTAL BICEPS TENDON REPAIR: SHX1461

## 2020-11-30 LAB — CBC WITH DIFFERENTIAL/PLATELET
Abs Immature Granulocytes: 0.04 10*3/uL (ref 0.00–0.07)
Basophils Absolute: 0 10*3/uL (ref 0.0–0.1)
Basophils Relative: 0 %
Eosinophils Absolute: 0.1 10*3/uL (ref 0.0–0.5)
Eosinophils Relative: 1 %
HCT: 50.8 % (ref 39.0–52.0)
Hemoglobin: 17.2 g/dL — ABNORMAL HIGH (ref 13.0–17.0)
Immature Granulocytes: 0 %
Lymphocytes Relative: 20 %
Lymphs Abs: 2.1 10*3/uL (ref 0.7–4.0)
MCH: 33.4 pg (ref 26.0–34.0)
MCHC: 33.9 g/dL (ref 30.0–36.0)
MCV: 98.6 fL (ref 80.0–100.0)
Monocytes Absolute: 0.6 10*3/uL (ref 0.1–1.0)
Monocytes Relative: 5 %
Neutro Abs: 7.5 10*3/uL (ref 1.7–7.7)
Neutrophils Relative %: 74 %
Platelets: 298 10*3/uL (ref 150–400)
RBC: 5.15 MIL/uL (ref 4.22–5.81)
RDW: 12.3 % (ref 11.5–15.5)
WBC: 10.3 10*3/uL (ref 4.0–10.5)
nRBC: 0 % (ref 0.0–0.2)

## 2020-11-30 LAB — COMPREHENSIVE METABOLIC PANEL
ALT: 22 U/L (ref 0–44)
AST: 23 U/L (ref 15–41)
Albumin: 4.2 g/dL (ref 3.5–5.0)
Alkaline Phosphatase: 72 U/L (ref 38–126)
Anion gap: 8 (ref 5–15)
BUN: 25 mg/dL — ABNORMAL HIGH (ref 8–23)
CO2: 31 mmol/L (ref 22–32)
Calcium: 9.7 mg/dL (ref 8.9–10.3)
Chloride: 100 mmol/L (ref 98–111)
Creatinine, Ser: 1.16 mg/dL (ref 0.61–1.24)
GFR, Estimated: >60 mL/min (ref >60)
Glucose, Bld: 133 mg/dL — ABNORMAL HIGH (ref 70–99)
Potassium: 4.5 mmol/L (ref 3.5–5.1)
Sodium: 139 mmol/L (ref 135–145)
Total Bilirubin: 0.8 mg/dL (ref 0.3–1.2)
Total Protein: 7.5 g/dL (ref 6.5–8.1)

## 2020-11-30 SURGERY — REPAIR, TENDON, BICEPS, DISTAL
Anesthesia: General | Site: Elbow | Laterality: Left

## 2020-11-30 MED ORDER — BUPIVACAINE-EPINEPHRINE 0.5% -1:200000 IJ SOLN
INTRAMUSCULAR | Status: DC | PRN
  Administered 2020-11-30: 15 mL

## 2020-11-30 MED ORDER — ONDANSETRON HCL 4 MG/2ML IJ SOLN
INTRAMUSCULAR | Status: AC
  Filled 2020-11-30: qty 2

## 2020-11-30 MED ORDER — HYDROMORPHONE HCL 1 MG/ML IJ SOLN
INTRAMUSCULAR | Status: DC | PRN
  Administered 2020-11-30: .5 mg via INTRAVENOUS

## 2020-11-30 MED ORDER — MIDAZOLAM HCL 2 MG/2ML IJ SOLN
INTRAMUSCULAR | Status: AC
  Filled 2020-11-30: qty 2

## 2020-11-30 MED ORDER — OXYCODONE HCL 5 MG PO TABS
5.0000 mg | ORAL_TABLET | Freq: Once | ORAL | Status: AC | PRN
  Administered 2020-11-30: 5 mg via ORAL

## 2020-11-30 MED ORDER — ACETAMINOPHEN 10 MG/ML IV SOLN
INTRAVENOUS | Status: AC
  Filled 2020-11-30: qty 100

## 2020-11-30 MED ORDER — FENTANYL CITRATE (PF) 100 MCG/2ML IJ SOLN
INTRAMUSCULAR | Status: AC
  Filled 2020-11-30: qty 2

## 2020-11-30 MED ORDER — DEXMEDETOMIDINE (PRECEDEX) IN NS 20 MCG/5ML (4 MCG/ML) IV SYRINGE
PREFILLED_SYRINGE | INTRAVENOUS | Status: AC
  Filled 2020-11-30: qty 5

## 2020-11-30 MED ORDER — APREPITANT 40 MG PO CAPS: 40.0000 mg | ORAL_CAPSULE | Freq: Once | ORAL | Status: AC

## 2020-11-30 MED ORDER — OXYCODONE HCL 5 MG/5ML PO SOLN: 5.0000 mg | Freq: Once | ORAL | Status: AC | PRN

## 2020-11-30 MED ORDER — KETOROLAC TROMETHAMINE 15 MG/ML IJ SOLN: 15.0000 mg | Freq: Once | INTRAMUSCULAR | Status: DC

## 2020-11-30 MED ORDER — DEXMEDETOMIDINE (PRECEDEX) IN NS 20 MCG/5ML (4 MCG/ML) IV SYRINGE
PREFILLED_SYRINGE | INTRAVENOUS | Status: DC | PRN
  Administered 2020-11-30: 8 ug via INTRAVENOUS

## 2020-11-30 MED ORDER — MIDAZOLAM HCL 2 MG/2ML IJ SOLN
INTRAMUSCULAR | Status: DC | PRN
  Administered 2020-11-30 (×2): 1 mg via INTRAVENOUS

## 2020-11-30 MED ORDER — CHLORHEXIDINE GLUCONATE 0.12 % MT SOLN
OROMUCOSAL | Status: AC
  Administered 2020-11-30: 15 mL via OROMUCOSAL
  Filled 2020-11-30: qty 15

## 2020-11-30 MED ORDER — CEFAZOLIN SODIUM-DEXTROSE 2-4 GM/100ML-% IV SOLN
2.0000 g | INTRAVENOUS | Status: AC
  Administered 2020-11-30: 2 g via INTRAVENOUS

## 2020-11-30 MED ORDER — LIDOCAINE HCL (CARDIAC) PF 100 MG/5ML IV SOSY
PREFILLED_SYRINGE | INTRAVENOUS | Status: DC | PRN
  Administered 2020-11-30: 60 mg via INTRAVENOUS

## 2020-11-30 MED ORDER — ACETAMINOPHEN 10 MG/ML IV SOLN
INTRAVENOUS | Status: DC | PRN
  Administered 2020-11-30: 1000 mg via INTRAVENOUS

## 2020-11-30 MED ORDER — OXYCODONE HCL 5 MG PO TABS
ORAL_TABLET | ORAL | Status: AC
  Filled 2020-11-30: qty 1

## 2020-11-30 MED ORDER — CEFAZOLIN SODIUM-DEXTROSE 2-4 GM/100ML-% IV SOLN
INTRAVENOUS | Status: AC
  Filled 2020-11-30: qty 100

## 2020-11-30 MED ORDER — PROPOFOL 10 MG/ML IV BOLUS
INTRAVENOUS | Status: DC | PRN
  Administered 2020-11-30: 50 mg via INTRAVENOUS
  Administered 2020-11-30: 150 mg via INTRAVENOUS

## 2020-11-30 MED ORDER — METOCLOPRAMIDE HCL 10 MG PO TABS: 5.0000 mg | ORAL_TABLET | Freq: Three times a day (TID) | ORAL | Status: DC | PRN

## 2020-11-30 MED ORDER — HYDROCODONE-ACETAMINOPHEN 5-325 MG PO TABS: 1.0000 | ORAL_TABLET | Freq: Four times a day (QID) | ORAL | 0 refills | Status: DC | PRN

## 2020-11-30 MED ORDER — ORAL CARE MOUTH RINSE: 15.0000 mL | Freq: Once | OROMUCOSAL | Status: AC

## 2020-11-30 MED ORDER — MEPERIDINE HCL 25 MG/ML IJ SOLN: 6.2500 mg | INTRAMUSCULAR | Status: DC | PRN

## 2020-11-30 MED ORDER — KETAMINE HCL 50 MG/5ML IJ SOSY
PREFILLED_SYRINGE | INTRAMUSCULAR | Status: AC
  Filled 2020-11-30: qty 5

## 2020-11-30 MED ORDER — KETAMINE HCL 10 MG/ML IJ SOLN
INTRAMUSCULAR | Status: DC | PRN
  Administered 2020-11-30 (×2): 10 mg via INTRAVENOUS
  Administered 2020-11-30: 20 mg via INTRAVENOUS

## 2020-11-30 MED ORDER — CHLORHEXIDINE GLUCONATE 0.12 % MT SOLN: 15.0000 mL | Freq: Once | OROMUCOSAL | Status: AC

## 2020-11-30 MED ORDER — EPHEDRINE SULFATE 50 MG/ML IJ SOLN
INTRAMUSCULAR | Status: DC | PRN
  Administered 2020-11-30 (×2): 5 mg via INTRAVENOUS
  Administered 2020-11-30: 10 mg via INTRAVENOUS
  Administered 2020-11-30 (×2): 5 mg via INTRAVENOUS

## 2020-11-30 MED ORDER — FENTANYL CITRATE (PF) 100 MCG/2ML IJ SOLN
INTRAMUSCULAR | Status: DC | PRN
  Administered 2020-11-30 (×2): 50 ug via INTRAVENOUS

## 2020-11-30 MED ORDER — LACTATED RINGERS IV SOLN: INTRAVENOUS | Status: DC

## 2020-11-30 MED ORDER — DEXAMETHASONE SODIUM PHOSPHATE 10 MG/ML IJ SOLN
INTRAMUSCULAR | Status: DC | PRN
  Administered 2020-11-30: 10 mg via INTRAVENOUS

## 2020-11-30 MED ORDER — DEXAMETHASONE SODIUM PHOSPHATE 10 MG/ML IJ SOLN
INTRAMUSCULAR | Status: AC
  Filled 2020-11-30: qty 1

## 2020-11-30 MED ORDER — FENTANYL CITRATE (PF) 100 MCG/2ML IJ SOLN
25.0000 ug | INTRAMUSCULAR | Status: DC | PRN
  Administered 2020-11-30 (×2): 50 ug via INTRAVENOUS

## 2020-11-30 MED ORDER — 0.9 % SODIUM CHLORIDE (POUR BTL) OPTIME
TOPICAL | Status: DC | PRN
  Administered 2020-11-30: 500 mL

## 2020-11-30 MED ORDER — METOCLOPRAMIDE HCL 5 MG/ML IJ SOLN: 5.0000 mg | Freq: Three times a day (TID) | INTRAMUSCULAR | Status: DC | PRN

## 2020-11-30 MED ORDER — ONDANSETRON HCL 4 MG/2ML IJ SOLN
INTRAMUSCULAR | Status: DC | PRN
  Administered 2020-11-30: 4 mg via INTRAVENOUS

## 2020-11-30 MED ORDER — GLYCOPYRROLATE 0.2 MG/ML IJ SOLN
INTRAMUSCULAR | Status: DC | PRN
  Administered 2020-11-30 (×2): .1 mg via INTRAVENOUS

## 2020-11-30 MED ORDER — BUPIVACAINE-EPINEPHRINE (PF) 0.5% -1:200000 IJ SOLN
INTRAMUSCULAR | Status: AC
  Filled 2020-11-30: qty 30

## 2020-11-30 MED ORDER — PROMETHAZINE HCL 25 MG/ML IJ SOLN: 6.2500 mg | INTRAMUSCULAR | Status: DC | PRN

## 2020-11-30 MED ORDER — EPHEDRINE 5 MG/ML INJ
INTRAVENOUS | Status: AC
  Filled 2020-11-30: qty 5

## 2020-11-30 MED ORDER — OXYCODONE HCL 5 MG PO TABS
5.0000 mg | ORAL_TABLET | Freq: Once | ORAL | Status: AC
  Administered 2020-11-30: 5 mg via ORAL

## 2020-11-30 MED ORDER — HYDROMORPHONE HCL 1 MG/ML IJ SOLN
INTRAMUSCULAR | Status: AC
  Filled 2020-11-30: qty 1

## 2020-11-30 MED ORDER — APREPITANT 40 MG PO CAPS
ORAL_CAPSULE | ORAL | Status: AC
  Administered 2020-11-30: 40 mg via ORAL
  Filled 2020-11-30: qty 1

## 2020-11-30 MED ORDER — PHENYLEPHRINE HCL (PRESSORS) 10 MG/ML IV SOLN
INTRAVENOUS | Status: DC | PRN
  Administered 2020-11-30 (×3): 100 ug via INTRAVENOUS

## 2020-11-30 SURGICAL SUPPLY — 41 items
APL PRP STRL LF DISP 70% ISPRP (MISCELLANEOUS) ×2
BNDG COHESIVE 4X5 TAN ST LF (GAUZE/BANDAGES/DRESSINGS) ×2 IMPLANT
BNDG ELASTIC 4X5.8 VLCR STR LF (GAUZE/BANDAGES/DRESSINGS) ×2 IMPLANT
BNDG ESMARK 4X12 TAN STRL LF (GAUZE/BANDAGES/DRESSINGS) ×2 IMPLANT
CHLORAPREP W/TINT 26 (MISCELLANEOUS) ×4 IMPLANT
CUFF TOURN SGL QUICK 18X4 (TOURNIQUET CUFF) ×2 IMPLANT
CUFF TOURN SGL QUICK 24 (TOURNIQUET CUFF)
CUFF TRNQT CYL 24X4X16.5-23 (TOURNIQUET CUFF) IMPLANT
DRAPE FLUOR MINI C-ARM 54X84 (DRAPES) ×2 IMPLANT
DRAPE SURG 17X11 SM STRL (DRAPES) ×2 IMPLANT
DRAPE U-SHAPE 47X51 STRL (DRAPES) ×2 IMPLANT
ELECT REM PT RETURN 9FT ADLT (ELECTROSURGICAL) ×2
ELECTRODE REM PT RTRN 9FT ADLT (ELECTROSURGICAL) ×1 IMPLANT
GAUZE 4X4 16PLY ~~LOC~~+RFID DBL (SPONGE) ×2 IMPLANT
GAUZE SPONGE 4X4 12PLY STRL (GAUZE/BANDAGES/DRESSINGS) ×2 IMPLANT
GAUZE XEROFORM 1X8 LF (GAUZE/BANDAGES/DRESSINGS) ×2 IMPLANT
GLOVE SURG ENC MOIS LTX SZ8 (GLOVE) ×4 IMPLANT
GLOVE SURG UNDER LTX SZ8 (GLOVE) ×2 IMPLANT
GOWN STRL REUS W/ TWL LRG LVL3 (GOWN DISPOSABLE) ×2 IMPLANT
GOWN STRL REUS W/TWL LRG LVL3 (GOWN DISPOSABLE) ×4
IMPL TOGGLELOC ELBOW SYSTEM (Orthopedic Implant) ×1 IMPLANT
IMPLANT TOGGLELOC ELBOW SYSTEM (Orthopedic Implant) ×2 IMPLANT
KIT TURNOVER KIT A (KITS) ×2 IMPLANT
MANIFOLD NEPTUNE II (INSTRUMENTS) ×2 IMPLANT
NS IRRIG 500ML POUR BTL (IV SOLUTION) ×2 IMPLANT
PACK EXTREMITY ARMC (MISCELLANEOUS) ×2 IMPLANT
PAD CAST CTTN 4X4 STRL (SOFTGOODS) ×1 IMPLANT
PADDING CAST 4IN STRL (MISCELLANEOUS) ×2
PADDING CAST BLEND 4X4 STRL (MISCELLANEOUS) ×2 IMPLANT
PADDING CAST COTTON 4X4 STRL (SOFTGOODS) ×2
SLING ARM LRG DEEP (SOFTGOODS) ×2 IMPLANT
SPLINT CAST 1 STEP 4X30 (MISCELLANEOUS) ×4 IMPLANT
STOCKINETTE IMPERV 14X48 (MISCELLANEOUS) ×2 IMPLANT
STRIP CLOSURE SKIN 1/4X4 (GAUZE/BANDAGES/DRESSINGS) ×2 IMPLANT
SUT BRDBAND LOOP ST-NDL BL (SUTURE) ×2 IMPLANT
SUT VIC AB 2-0 SH 27 (SUTURE) ×2
SUT VIC AB 2-0 SH 27XBRD (SUTURE) ×1 IMPLANT
SUT VIC AB 3-0 SH 27 (SUTURE) ×2
SUT VIC AB 3-0 SH 27X BRD (SUTURE) ×1 IMPLANT
SWABSTK COMLB BENZOIN TINCTURE (MISCELLANEOUS) ×2 IMPLANT
WATER STERILE IRR 500ML POUR (IV SOLUTION) ×2 IMPLANT

## 2020-11-30 NOTE — Anesthesia Procedure Notes (Signed)
Procedure Name: LMA Insertion Date/Time: 11/30/2020 1:23 PM Performed by: Kerri Perches, CRNA Pre-anesthesia Checklist: Patient identified, Patient being monitored, Timeout performed, Emergency Drugs available and Suction available Patient Re-evaluated:Patient Re-evaluated prior to induction Oxygen Delivery Method: Circle system utilized Preoxygenation: Pre-oxygenation with 100% oxygen Induction Type: IV induction LMA: LMA inserted LMA Size: 4.0 Tube type: Oral Number of attempts: 1 Placement Confirmation: positive ETCO2 and breath sounds checked- equal and bilateral Tube secured with: Tape Dental Injury: Teeth and Oropharynx as per pre-operative assessment

## 2020-11-30 NOTE — Anesthesia Preprocedure Evaluation (Addendum)
Anesthesia Evaluation  Patient identified by MRN, date of birth, ID band Patient awake    Reviewed: Allergy & Precautions, NPO status , Patient's Chart, lab work & pertinent test results  History of Anesthesia Complications (+) PONV and history of anesthetic complications  Airway Mallampati: III  TM Distance: >3 FB Neck ROM: Full    Dental  (+) Poor Dentition   Pulmonary neg sleep apnea, neg COPD, Current SmokerPatient did not abstain from smoking.,    breath sounds clear to auscultation- rhonchi (-) wheezing      Cardiovascular hypertension, Pt. on medications (-) CAD, (-) Past MI, (-) Cardiac Stents and (-) CABG  Rhythm:Regular Rate:Normal - Systolic murmurs and - Diastolic murmurs    Neuro/Psych neg Seizures PSYCHIATRIC DISORDERS Anxiety negative neurological ROS     GI/Hepatic Neg liver ROS, hiatal hernia,   Endo/Other  negative endocrine ROSneg diabetes  Renal/GU negative Renal ROS     Musculoskeletal negative musculoskeletal ROS (+)   Abdominal (+) - obese,   Peds  Hematology negative hematology ROS (+)   Anesthesia Other Findings Past Medical History: No date: Anxiety No date: BPH without urinary obstruction No date: Diverticulosis No date: ED (erectile dysfunction) No date: Glaucoma (increased eye pressure) No date: Hemorrhoids No date: History of colon polyps No date: History of hiatal hernia No date: Hyperlipemia No date: Hyperlipidemia No date: Hypertension No date: Hyperthyroidism No date: Impaired glucose tolerance   Reproductive/Obstetrics                             Anesthesia Physical Anesthesia Plan  ASA: 2  Anesthesia Plan: General   Post-op Pain Management:    Induction: Intravenous  PONV Risk Score and Plan: 1 and Ondansetron, Dexamethasone and Aprepitant  Airway Management Planned: Oral ETT  Additional Equipment:   Intra-op Plan:    Post-operative Plan: Extubation in OR  Informed Consent: I have reviewed the patients History and Physical, chart, labs and discussed the procedure including the risks, benefits and alternatives for the proposed anesthesia with the patient or authorized representative who has indicated his/her understanding and acceptance.     Dental advisory given  Plan Discussed with: CRNA and Anesthesiologist  Anesthesia Plan Comments:        Anesthesia Quick Evaluation

## 2020-11-30 NOTE — Discharge Instructions (Addendum)
AMBULATORY SURGERY  DISCHARGE INSTRUCTIONS   The drugs that you were given will stay in your system until tomorrow so for the next 24 hours you should not:  Drive an automobile Make any legal decisions Drink any alcoholic beverage   You may resume regular meals tomorrow.  Today it is better to start with liquids and gradually work up to solid foods.  You may eat anything you prefer, but it is better to start with liquids, then soup and crackers, and gradually work up to solid foods.   Please notify your doctor immediately if you have any unusual bleeding, trouble breathing, redness and pain at the surgery site, drainage, fever, or pain not relieved by medication.        Please contact your physician with any problems or Same Day Surgery at [phone], Monday through Friday 6 am to 4 pm, or [place] [facility] number at [phone].    Orthopedic discharge instructions: Keep splint dry and intact. Keep hand elevated above heart level. Apply ice to affected area frequently. Take ibuprofen 600-800 mg TID with meals for 7-10 days, then as necessary. Take pain medication as prescribed or ES Tylenol when needed.  Return for follow-up in 10-14 days or as scheduled.

## 2020-11-30 NOTE — Op Note (Signed)
11/30/2020  3:20 PM  Patient:   Alexander Short  Pre-Op Diagnosis:   Distal biceps tendon rupture, left elbow.  Post-Op Diagnosis:   Same.  Procedure:   Primary repair of ruptured distal biceps tendon, left elbow.  Surgeon:   Pascal Lux, MD  Assistant:   Douglass Rivers, PA-S  Anesthesia:   General LMA  Findings:   As above.  Complications:   None  EBL:   5 cc  Fluids:   1100 cc crystalloid  TT:   49 minutes at 250 mmHg  Drains:   None  Closure:   3-0 Vicryl subcuticular sutures  Implants:   Biomet ToggleLoc 1  Brief Clinical Note:   The patient is a 65 year old male who sustained the above-noted injury 1.5 months ago after he apparently injured it while playing golf.  He states that he injured his elbow further several weeks later while trying to lift weights, prompting him to present for evaluation and treatment.  Examination in the office confirmed the presence of a distal biceps tendon tear.  The patient presents at this time for definitive management of his injury.  Procedure:   The patient was brought into the operating room and lain in the supine position. After adequate general laryngeal mask anesthesia was achieved, the left upper extremity and hand were prepped with ChloraPrep solution before being draped sterilely. Preoperative antibiotics were administered. A timeout was performed to verify the appropriate surgical site. Given how proximal the tendon was, it was felt best to retrieve the tendon before applying a sterile tourniquet.   A 2 to 2.5 cm incision was made over the palpable tendon in the anterior aspect of the upper arm. This incision was carried down through the subcutaneous tissues. The fascia was penetrated to expose the tendon. The tendon was then dissected free from surrounding adhesions. The distal portion of the tendon was debrided of degenerative tissues before a #2 OrthoSorb suture was woven through the tendon.   At this point, the limb was  exsanguinated with an Esmarch and the tourniquet inflated to 250 mmHg. A 6-7 centimeter longitudinal incision was extended distally from the elbow flexion crease. The incision was carried down through the subcutaneous cutaneous tissues with care taken to identify and protect the neurovascular structures in the area. A subcutaneous tunnel was created utilizing the old bicipital track and the distal biceps tendon stump pulled distally into the more distal incision. The loop of the Biomet ToggleLoc device was then secured to the distal end of the tendon by tying the ends of this #2 suture tightly.   Attention was directed distally. The bicipital sheath was followed down to the proximal radius using blunt dissection. With care, the bicipital tuberosity was identified by palpation and then dissected free of adjacent soft tissue. As a precaution, the recurrent branch of the radial artery was identified, tied off, and transected. While holding the forearm in maximal supination, a guidewire was placed and its position verified using FluoroScan imaging in AP and lateral projections. The guidewire was overreamed with an 8 mm acorn reamer through the anterior cortex before the posterior cortex was overreamed with a 4.5 mm reamer. The ToggleLoc anchor was pushed through the holes drilled into the bicipital tuberosity before it was deployed. The adequacy of fixation of the ToggleLoc anchor was verified by pulling strongly on the sutures. The tendon was cinched into the bony socket with the elbow flexed to 90. The adequacy of anchor position was verified fluoroscopically in AP  and lateral projections and found to be excellent. The ToggleLoc sutures were then cut and removed. The elbow was ranged passively and the repair was deemed to be stable to within 65 of extension.  The wounds were copiously irrigated with sterile saline solution before the subcutaneous tissues were closed with 2-0 Vicryl interrupted sutures. 3-0  Vicryl subcuticular sutures were used to close the skin before Benzoin and steri-strips were applied to the skin. A total of 15 cc of 0.5% plain Sensorcaine was injected in and around the incision sites. Sterile bulky dressings were applied to the elbow before the arm was placed into a posterior splint maintaining the elbow at approximately 90 of flexion and in neutral rotation. The patient was then awakened, extubated, and returned to the recovery room in satisfactory condition after tolerating the procedure well.

## 2020-11-30 NOTE — Transfer of Care (Signed)
Immediate Anesthesia Transfer of Care Note  Patient: Alexander Short  Procedure(s) Performed: DISTAL BICEPS TENDON REPAIR (Left: Elbow)  Patient Location: PACU  Anesthesia Type:General  Level of Consciousness: awake  Airway & Oxygen Therapy: Patient Spontanous Breathing  Post-op Assessment: Report given to RN  Post vital signs: stable  Last Vitals:  Vitals Value Taken Time  BP    Temp    Pulse    Resp    SpO2      Last Pain:  Vitals:   11/30/20 1059  TempSrc: Temporal  PainSc: 0-No pain         Complications: No notable events documented.

## 2020-11-30 NOTE — H&P (Signed)
History of Present Illness: Alexander Short is a 65 y.o. male who presents today for evaluation of left distal biceps pain, swelling and deformity. Patient states 4 weeks ago he was playing golf, felt pain in the distal biceps region. Had soreness to the area. 2 weeks ago he was performing a sumo dead lift high pull and felt a pop along the distal biceps tendon. Also noticed deformity and softness to the biceps region. Patient is left-hand dominant. He has noted weakness with the bicep. He is very active enjoys playing golf and lifting weights. 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 Denies any numbness or tingling or shoulder pain.  Past Medical History:  Adenomatous colon polyp 2013   Adrenal nodule (CMS-HCC) 05/2018 (Incidental finding on CT abd of Lt 2 cm adrenal nodule. Endo wrkp negative; doesn't need further f/u)   Anxiety (Started in his 20's. Long standing benzo use. Referred to psych 7/16 by previous providers at [facility] & was told benzo would no longer be prescribed)   Aortic atherosclerosis (CMS-HCC) 05/2018 (Incidental finding on CT of the abd. Severe mixed calcific atherosclerosis of the abdominal aorta. Eval'd by vascular surg rec 2 yr flu)   Barrett's esophagus 2020   BPH without urinary obstruction   COVID-19 06/2020   Diverticulosis 2013   Epidermal inclusion cyst 2020 (Rt index finger)  Erectile dysfunction   Glaucoma (increased eye pressure)   Hemorrhoids   Hiatal hernia with gastroesophageal reflux   Hyperlipidemia   Hypertension   Hyperthyroidism, unspecified   Impaired glucose tolerance (A1c 6.1% 1/16)   Past Surgical History:  BYPASS GRAFT AORTOBIFEMORAL   COLONOSCOPY 10/25/2011   COLONOSCOPY 07/11/2018 (Serrated adenoma/Tubular adenoma colon polyps/Hyperplastic colon polyp/Repeat 63yrs/MUS)   COLONOSCOPY   EGD 10/25/2011   EGD 07/11/2018 (Gastritis/Barrett's Esophagus/Repeat 36yrs/MUS)   HEMORRHOIDECTOMY BY SIMPLE LIGATION    TONSILLECTOMY   UPPER GASTROINTESTINAL ENDOSCOPY   Past Family History:  Coronary Artery Disease (Blocked arteries around heart) Mother   Breast cancer Mother   Tuberculosis Mother   Esophageal cancer Father   Myocardial Infarction (Heart attack) Father   Stroke Father   Diabetes type II Sister   COPD Brother (Smoker; requiring O2)   Myocardial Infarction (Heart attack) Brother x2   Medications:  ALPRAZolam (XANAX) 1 MG tablet Take 1 mg by mouth 3 (three) times daily as needed for Sleep   buPROPion (WELLBUTRIN XL) 300 MG XL tablet Take 1 tablet by mouth once daily   Herbal Supplement Herbal Name: Nicotine gum chew 1 piece by mouth as needed   Lactobacillus acidophilus (PROBIOTIC ORAL) Take 1 tablet by mouth once daily   lisinopriL-hydrochlorothiazide (ZESTORETIC) 20-25 mg tablet Take 1 tablet by mouth once daily 90 tablet 1   NICOTINE INHAL Inhale into the lungs as needed If the nicotine gum does not work.   pantoprazole (PROTONIX) 40 MG DR tablet Take 1 tablet (40 mg total) by mouth once daily 90 tablet 1   sildenafiL (VIAGRA) 100 MG tablet TAKE 1 TABLET BY MOUTH AS NEEDED AS DIRECTED 10 tablet 6   simvastatin (ZOCOR) 20 MG tablet Take 1 tablet (20 mg total) by mouth every morning 90 tablet 1   Allergies: No Known Allergies   Review of Systems:  A comprehensive 14 point ROS was performed, reviewed by me today, and the pertinent orthopaedic findings are documented in the HPI.  Physical Exam: BP (!) 140/80  Wt 74.3 kg (163 lb 12.8 oz)  BMI 26.84 kg/m  General:  Well developed, well nourished, no apparent distress, normal affect, normal gait with no antalgic component.   HEENT: Head normocephalic, atraumatic, PERRL.   Abdomen: Soft, non tender, non distended, Bowel sounds present.  Heart: Examination of the heart reveals regular, rate, and rhythm. There is no murmur noted on ascultation. There is a normal apical pulse.  Lungs: Lungs are clear to auscultation. There is  no wheeze, rhonchi, or crackles. There is normal expansion of bilateral chest walls.   Left upper extremity: Left upper extremity shows patient has palpable defect in the left distal bicep. He has a negative hook test with asymmetrical deformity to the bicep region. He has some weakness with resisted biceps and supination strength. He is neurovascular intact in left upper extremity. No swelling throughout the elbow. No ecchymosis.  AP lateral oblique views of the left elbow are ordered interpreted by me in the office today. Impression: Patient has no evidence of acute bony abnormality. No effusion. No abnormal soft tissue swelling. No significant arthropathy or loose bodies or spurring.  MRI LEFT ELBOW WITHOUT CONTRAST: 1. Complete tear of the distal biceps tendon with mild retraction of the tendon suspected. There is surrounding soft tissue edema. 2. Small nonspecific elbow joint effusion. 3. Negative for acute fracture or dislocation.   Impression: Rupture of left distal biceps tendon,  Plan:  The treatment options, including both surgical and nonsurgical choices, have been discussed in detail with the patient. The patient would like to proceed with surgical intervention to include a primary repair of his left distal biceps tendon rupture. The risks (including bleeding, infection, nerve and/or blood vessel injury, persistent or recurrent pain, failure of the repair, recurrence of the tear, weakness of elbow flexion, limited range of motion, need for further surgery, blood clots, strokes, heart attacks or arrhythmias, pneumonia, etc.) and benefits of the surgical procedure were discussed. The patient states his understanding and agrees to proceed. A formal written consent will be obtained by the nursing staff.   H&P reviewed and patient re-examined. No changes.

## 2020-12-01 ENCOUNTER — Encounter: Payer: Self-pay | Admitting: Surgery

## 2020-12-02 NOTE — Anesthesia Postprocedure Evaluation (Signed)
Anesthesia Post Note  Patient: Alexander Short  Procedure(s) Performed: DISTAL BICEPS TENDON REPAIR (Left: Elbow)  Patient location during evaluation: PACU Anesthesia Type: General Level of consciousness: awake and alert Pain management: pain level controlled Vital Signs Assessment: post-procedure vital signs reviewed and stable Respiratory status: spontaneous breathing, nonlabored ventilation, respiratory function stable and patient connected to nasal cannula oxygen Cardiovascular status: blood pressure returned to baseline and stable Postop Assessment: no apparent nausea or vomiting Anesthetic complications: no   No notable events documented.   Last Vitals:  Vitals:   11/30/20 1545 11/30/20 1606  BP: (!) 140/92 140/90  Pulse: 80 85  Resp: 12 16  Temp: (!) 36.1 C   SpO2: 96% 100%    Last Pain:  Vitals:   11/30/20 1638  TempSrc:   PainSc: [place]

## 2021-05-04 IMAGING — CT CT ABDOMEN AND PELVIS WITH CONTRAST
1 of 3 series · 13 of 32 positions shown, 18 images · IV contrast (APPLIED)
Comparison: 07/26/2010

CLINICAL DATA: Increasing diffuse abdominal pain

EXAM:
CT ABDOMEN AND PELVIS WITH CONTRAST
TECHNIQUE: Multidetector CT imaging of the abdomen and pelvis was performed
using the standard protocol following bolus administration of
intravenous contrast.
CONTRAST:  75mL OMNIPAQUE IOHEXOL 300 MG/ML SOLN, additional oral
enteric contrast

[Series 2: axial st · axial · 0.73mm/px · z∈[-496,-106]mm · 13 of 88 slices shown, 18 images]
[im 5/88  soft-tissue]
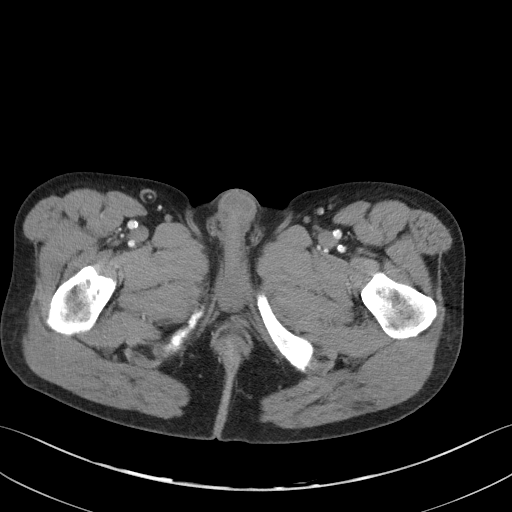
[im 5/88  bone]
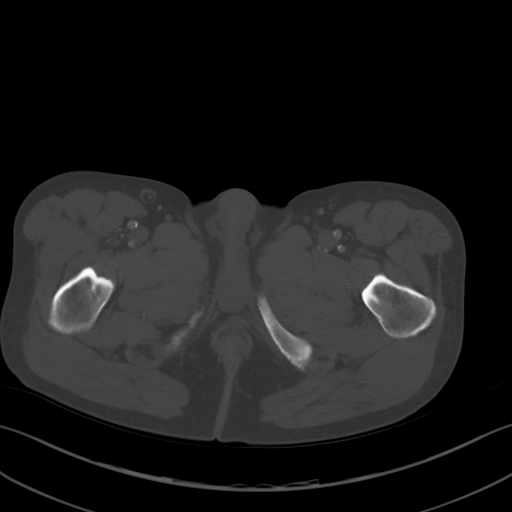
[im 14/88  soft-tissue]
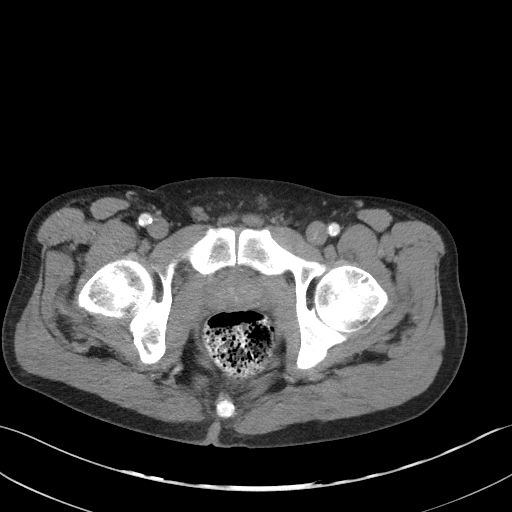
[im 19/88  soft-tissue]
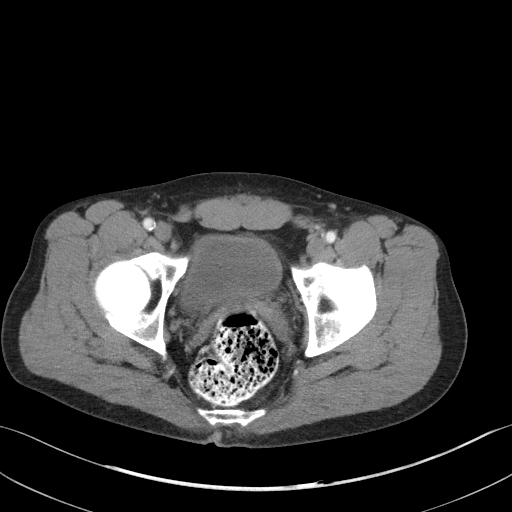
[im 28/88  soft-tissue]
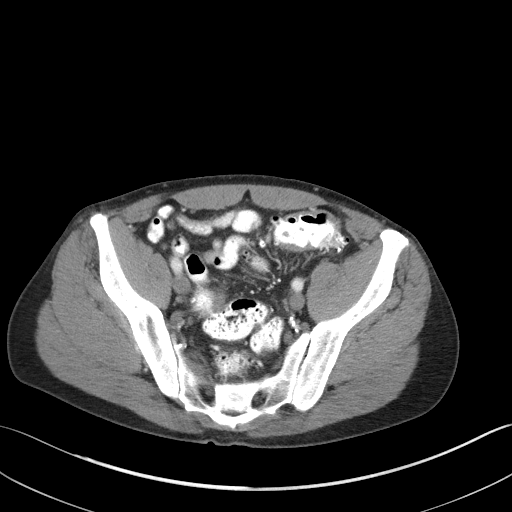
[im 33/88  soft-tissue]
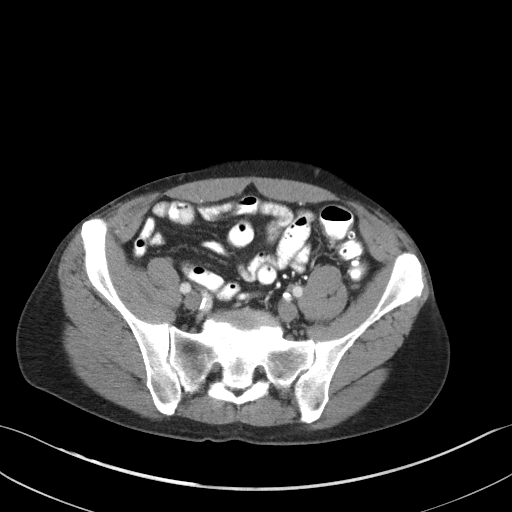
[im 42/88  soft-tissue]
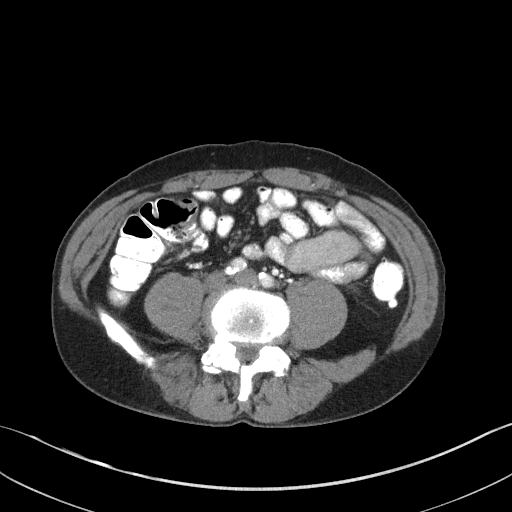
[im 46/88  soft-tissue]
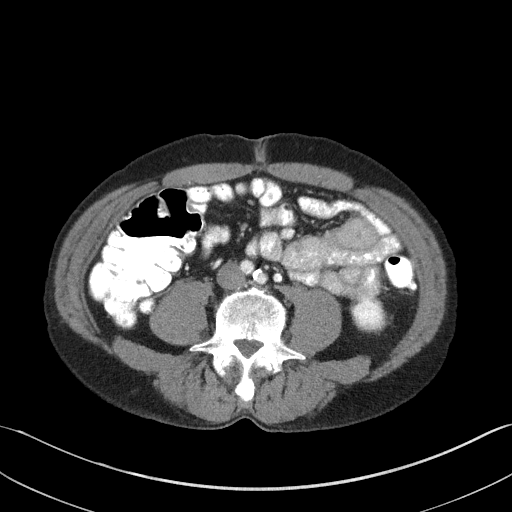
[im 55/88  soft-tissue]
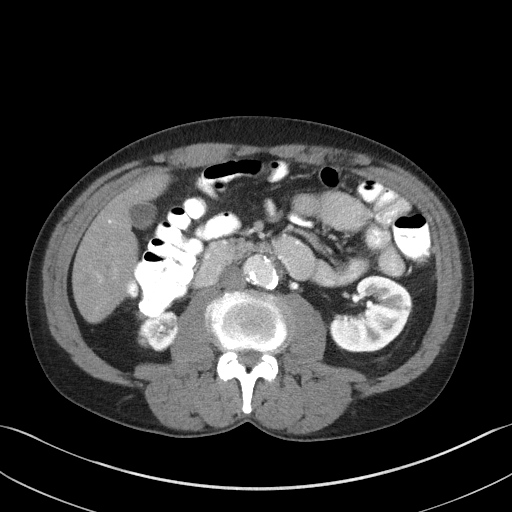
[im 60/88  soft-tissue]
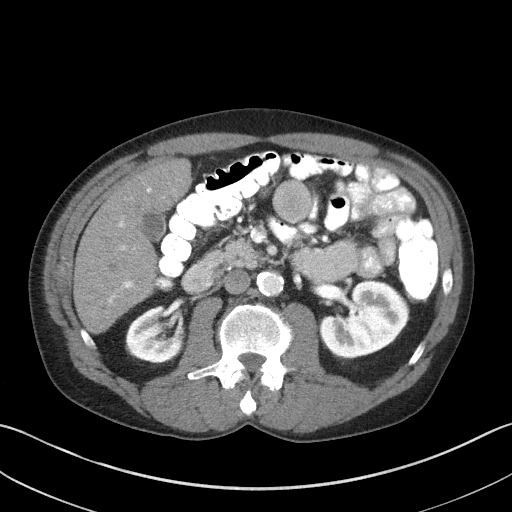
[im 60/88  bone]
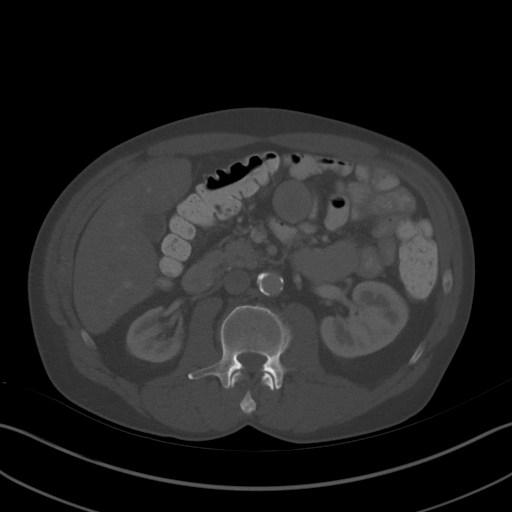
[im 69/88  soft-tissue]
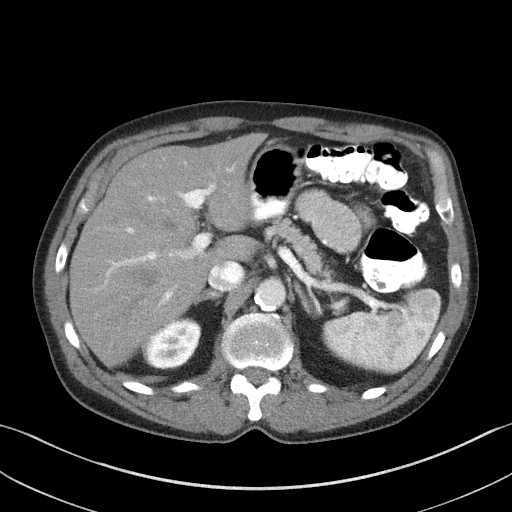
[im 69/88  lung]
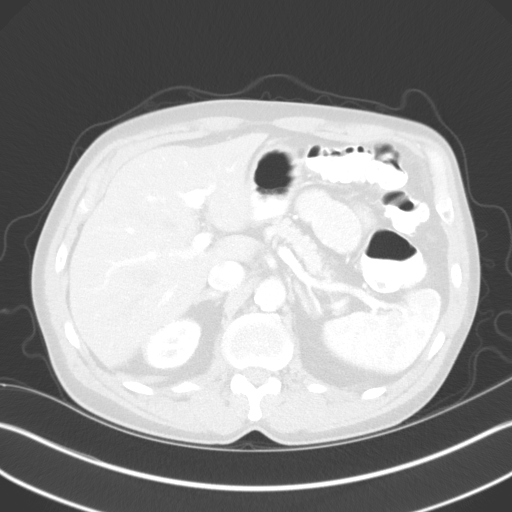
[im 74/88  soft-tissue]
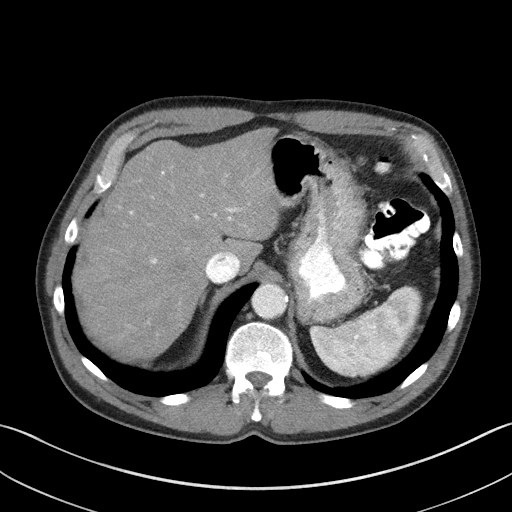
[im 74/88  lung]
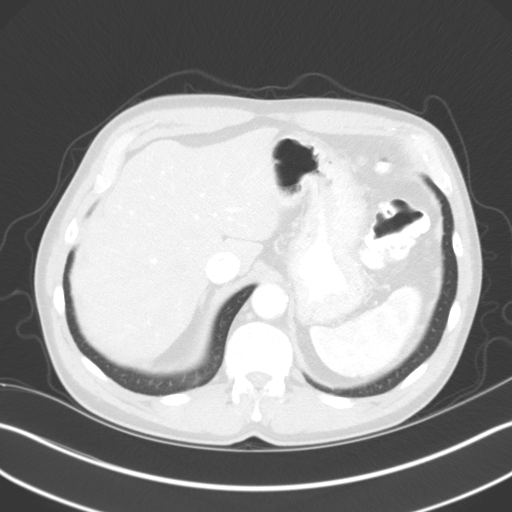
[im 78/88  lung]
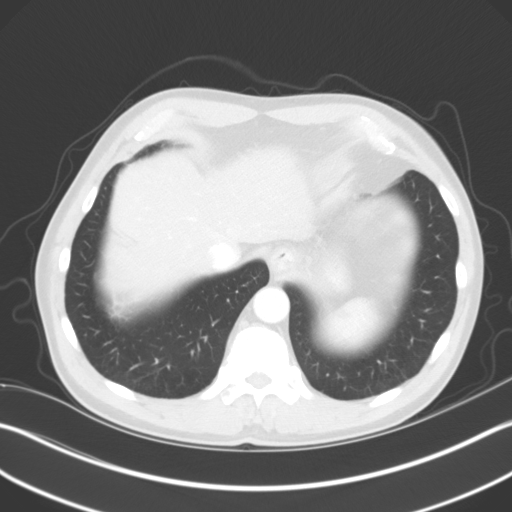
[im 83/88  soft-tissue]
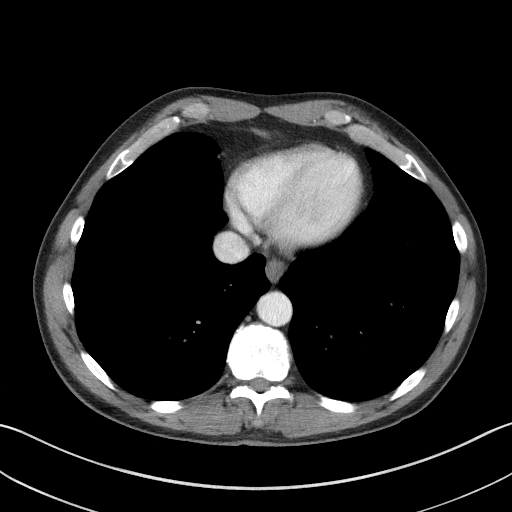
[im 83/88  lung]
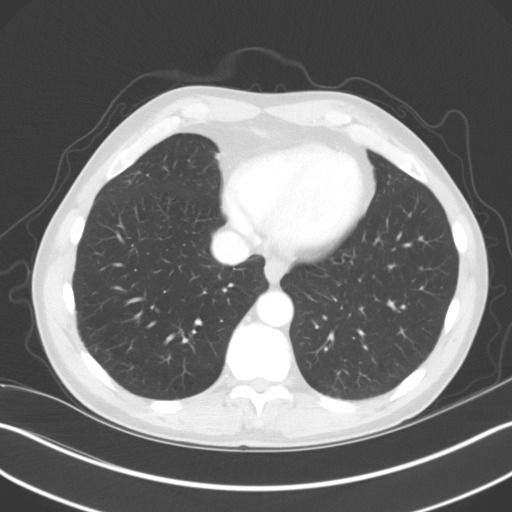

[13 of 32 positions shown; findings below may reference images not displayed]

FINDINGS: Lower chest: No acute abnormality.

Hepatobiliary: No focal liver abnormality is seen. No gallstones,
gallbladder wall thickening, or biliary dilatation.

Pancreas: Unremarkable. No pancreatic ductal dilatation or
surrounding inflammatory changes.

Spleen: Normal in size without focal abnormality.

Adrenals/Urinary Tract: There is a new 2.0 cm soft tissue
attenuation nodule of the left adrenal gland (series 2, image 25).
Kidneys are normal, without renal calculi, focal lesion, or
hydronephrosis. Bladder is unremarkable.

Stomach/Bowel: Stomach is within normal limits. Appendix appears
normal. No evidence of bowel wall thickening, distention, or
inflammatory changes. Sigmoid diverticulosis.

Vascular/Lymphatic: Severe mixed calcific atherosclerosis of the
abdominal aorta and branch vessels. There is a focal ectasia of the
infrarenal abdominal aorta with maximum diameter approximately
cm, slightly increased compared to prior CT dated 07/26/2010. No
enlarged abdominal or pelvic lymph nodes.

Reproductive: No mass or other abnormality.

Other: No abdominal wall hernia or abnormality. No abdominopelvic
ascites.

Musculoskeletal: No acute or significant osseous findings.
IMPRESSION: 1. No acute CT findings of the abdomen or pelvis to explain pain,
nausea, or vomiting.

2. There is a new nonspecific 2.0 cm soft tissue attenuation nodule
of the left adrenal gland. Recommend follow-up adrenal protocol CT
or MRI at 12 months for further characterization and to ensure
stability.

3. Severe mixed calcific atherosclerosis of the abdominal aorta and
branch vessels. There is a focal ectasia of the infrarenal abdominal
aorta with maximum diameter approximately 2.8 cm, slightly increased
compared to prior CT dated 07/26/2010. Consider vascular
consultation and follow-up CT to ensure stability.

## 2021-09-09 ENCOUNTER — Emergency Department: Payer: No Typology Code available for payment source

## 2021-09-09 ENCOUNTER — Other Ambulatory Visit: Payer: Self-pay

## 2021-09-09 DIAGNOSIS — I10 Essential (primary) hypertension: Secondary | ICD-10-CM | POA: Insufficient documentation

## 2021-09-09 DIAGNOSIS — M25572 Pain in left ankle and joints of left foot: Secondary | ICD-10-CM | POA: Insufficient documentation

## 2021-09-09 DIAGNOSIS — Z79899 Other long term (current) drug therapy: Secondary | ICD-10-CM | POA: Insufficient documentation

## 2021-09-09 DIAGNOSIS — S8991XA Unspecified injury of right lower leg, initial encounter: Secondary | ICD-10-CM | POA: Diagnosis present

## 2021-09-09 DIAGNOSIS — Y92410 Unspecified street and highway as the place of occurrence of the external cause: Secondary | ICD-10-CM | POA: Diagnosis not present

## 2021-09-09 DIAGNOSIS — S82144A Nondisplaced bicondylar fracture of right tibia, initial encounter for closed fracture: Secondary | ICD-10-CM | POA: Diagnosis not present
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
## 2021-09-10 ENCOUNTER — Emergency Department: Payer: No Typology Code available for payment source

## 2021-09-10 ENCOUNTER — Emergency Department
Admission: EM | Admit: 2021-09-10 | Discharge: 2021-09-10 | Disposition: A | Discharge status: Home / Self Care | Payer: No Typology Code available for payment source | Attending: Emergency Medicine | Admitting: Emergency Medicine

## 2021-09-10 DIAGNOSIS — S82131A Displaced fracture of medial condyle of right tibia, initial encounter for closed fracture: Secondary | ICD-10-CM

## 2021-09-10 MED ORDER — ONDANSETRON 4 MG PO TBDP
4.0000 mg | ORAL_TABLET | Freq: Once | ORAL | Status: AC
  Administered 2021-09-10: 4 mg via ORAL
  Filled 2021-09-10: qty 1

## 2021-09-10 MED ORDER — HYDROCODONE-ACETAMINOPHEN 5-325 MG PO TABS
2.0000 | ORAL_TABLET | Freq: Once | ORAL | Status: AC
  Administered 2021-09-10: 2 via ORAL
  Filled 2021-09-10: qty 2

## 2021-09-10 MED ORDER — ONDANSETRON 4 MG PO TBDP: 4.0000 mg | ORAL_TABLET | Freq: Four times a day (QID) | ORAL | 0 refills | Status: AC | PRN

## 2021-09-10 MED ORDER — HYDROCODONE-ACETAMINOPHEN 5-325 MG PO TABS: 2.0000 | ORAL_TABLET | Freq: Four times a day (QID) | ORAL | 0 refills | Status: AC | PRN
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
## 2021-09-10 NOTE — Discharge Instructions (Addendum)
You are being provided a prescription for opiates (also known as narcotics) for pain control.  Opiates can be addictive and should only be used when absolutely necessary for pain control when other alternatives do not work.  We recommend you only use them for the recommended amount of time and only as prescribed.  Please do not take with other sedative medications or alcohol.  Please do not drive, operate machinery, make important decisions while taking opiates.  Please note that these medications can be addictive and have high abuse potential.  Patients can become addicted to narcotics after only taking them for a few days.  Please keep these medications locked away from children, teenagers or any family members with history of substance abuse.  Narcotic pain medicine may also make you constipated.  You may use over-the-counter medications such as MiraLAX, Colace to prevent constipation.  If you become constipated, you may use over-the-counter enemas as needed.  Itching and nausea are also common side effects of narcotic pain medication.  If you develop uncontrolled vomiting or a rash, please stop these medications and seek medical care.  Please follow-up closely with orthopedics for your nondisplaced right tibial plateau fracture.  Please keep your knee immobilizer on at all times and use crutches.  You are not allowed to bear any weight on this right leg until cleared by orthopedics.

## 2021-11-16 ENCOUNTER — Other Ambulatory Visit: Payer: Medicare Other

## 2021-11-16 NOTE — Progress Notes (Signed)
Pt completed UDS, and sent for further testing./CL,RMA

## 2022-03-14 ENCOUNTER — Other Ambulatory Visit: Payer: Self-pay | Admitting: Nurse Practitioner

## 2022-03-14 DIAGNOSIS — F17200 Nicotine dependence, unspecified, uncomplicated: Secondary | ICD-10-CM

## 2022-03-14 DIAGNOSIS — I10 Essential (primary) hypertension: Secondary | ICD-10-CM

## 2022-03-14 DIAGNOSIS — E7800 Pure hypercholesterolemia, unspecified: Secondary | ICD-10-CM

## 2022-03-14 DIAGNOSIS — Z136 Encounter for screening for cardiovascular disorders: Secondary | ICD-10-CM

## 2022-03-23 ENCOUNTER — Ambulatory Visit: Payer: Medicare Other

## 2022-03-27 ENCOUNTER — Ambulatory Visit
Admission: RE | Admit: 2022-03-27 | Discharge: 2022-03-27 | Disposition: A | Discharge status: Home / Self Care | Payer: Medicare Other | Source: Ambulatory Visit | Attending: Nurse Practitioner | Admitting: Nurse Practitioner

## 2022-03-27 DIAGNOSIS — I714 Abdominal aortic aneurysm, without rupture, unspecified: Secondary | ICD-10-CM | POA: Diagnosis not present

## 2022-03-27 DIAGNOSIS — E7800 Pure hypercholesterolemia, unspecified: Secondary | ICD-10-CM

## 2022-03-27 DIAGNOSIS — Z136 Encounter for screening for cardiovascular disorders: Secondary | ICD-10-CM

## 2022-03-27 DIAGNOSIS — F17200 Nicotine dependence, unspecified, uncomplicated: Secondary | ICD-10-CM

## 2022-03-27 DIAGNOSIS — I10 Essential (primary) hypertension: Secondary | ICD-10-CM | POA: Diagnosis not present

## 2022-03-27 DIAGNOSIS — K760 Fatty (change of) liver, not elsewhere classified: Secondary | ICD-10-CM | POA: Diagnosis not present
